# Patient Record
Sex: Female | Born: 2000 | Race: Black or African American | Hispanic: No | Marital: Single | Smoking: Never smoker
Health system: Southern US, Community
[De-identification: ages and names within clinical notes are randomized; demographics above are authoritative.]

## PROBLEM LIST (undated history)

## (undated) DIAGNOSIS — Z9889 Other specified postprocedural states: Secondary | ICD-10-CM

## (undated) DIAGNOSIS — R112 Nausea with vomiting, unspecified: Secondary | ICD-10-CM

## (undated) DIAGNOSIS — N926 Irregular menstruation, unspecified: Secondary | ICD-10-CM

## (undated) DIAGNOSIS — E669 Obesity, unspecified: Secondary | ICD-10-CM

## (undated) DIAGNOSIS — G43909 Migraine, unspecified, not intractable, without status migrainosus: Secondary | ICD-10-CM

## (undated) DIAGNOSIS — Z9109 Other allergy status, other than to drugs and biological substances: Secondary | ICD-10-CM

## (undated) DIAGNOSIS — T4145XA Adverse effect of unspecified anesthetic, initial encounter: Secondary | ICD-10-CM

## (undated) DIAGNOSIS — L732 Hidradenitis suppurativa: Secondary | ICD-10-CM

## (undated) DIAGNOSIS — M62838 Other muscle spasm: Secondary | ICD-10-CM

## (undated) DIAGNOSIS — K219 Gastro-esophageal reflux disease without esophagitis: Secondary | ICD-10-CM

## (undated) DIAGNOSIS — J45909 Unspecified asthma, uncomplicated: Secondary | ICD-10-CM

## (undated) HISTORY — PX: APPENDECTOMY: SHX54

## (undated) HISTORY — PX: TONSILLECTOMY: SUR1361

## (undated) SURGERY — EXCISION, HIDRADENITIS, AXILLA
Anesthesia: General | Laterality: Right

---
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
## 2019-01-30 ENCOUNTER — Other Ambulatory Visit: Payer: Self-pay

## 2019-01-30 ENCOUNTER — Encounter (HOSPITAL_COMMUNITY): Payer: Self-pay | Admitting: *Deleted

## 2019-01-30 ENCOUNTER — Emergency Department (HOSPITAL_COMMUNITY)
Admission: EM | Admit: 2019-01-30 | Discharge: 2019-01-30 | Disposition: A | Discharge status: Home / Self Care | Payer: Medicaid Other | Attending: Emergency Medicine | Admitting: Emergency Medicine

## 2019-01-30 DIAGNOSIS — Z7722 Contact with and (suspected) exposure to environmental tobacco smoke (acute) (chronic): Secondary | ICD-10-CM | POA: Diagnosis not present

## 2019-01-30 DIAGNOSIS — J45909 Unspecified asthma, uncomplicated: Secondary | ICD-10-CM | POA: Insufficient documentation

## 2019-01-30 DIAGNOSIS — Z79899 Other long term (current) drug therapy: Secondary | ICD-10-CM | POA: Diagnosis not present

## 2019-01-30 DIAGNOSIS — J020 Streptococcal pharyngitis: Secondary | ICD-10-CM | POA: Diagnosis not present

## 2019-01-30 DIAGNOSIS — J029 Acute pharyngitis, unspecified: Secondary | ICD-10-CM | POA: Diagnosis present

## 2019-01-30 LAB — GROUP A STREP BY PCR: Group A Strep by PCR: DETECTED — AB

## 2019-01-30 MED ORDER — AMOXICILLIN 500 MG PO CAPS
1000.0000 mg | ORAL_CAPSULE | Freq: Once | ORAL | Status: AC
  Administered 2019-01-30: 1000 mg via ORAL
  Filled 2019-01-30: qty 2

## 2019-01-30 MED ORDER — LIDOCAINE VISCOUS HCL 2 % MT SOLN
15.0000 mL | Freq: Once | OROMUCOSAL | Status: AC
  Administered 2019-01-30: 15 mL via OROMUCOSAL
  Filled 2019-01-30: qty 15

## 2019-01-30 MED ORDER — AMOXICILLIN 500 MG PO CAPS: 1000.0000 mg | ORAL_CAPSULE | Freq: Every day | ORAL | 0 refills | Status: AC

## 2019-01-30 MED ORDER — LIDOCAINE VISCOUS HCL 2 % MT SOLN: 15.0000 mL | OROMUCOSAL | 0 refills | Status: AC | PRN

## 2019-01-30 NOTE — ED Triage Notes (Signed)
To ED for eval of sore throat. Feels like her throat is closing. States this started yesterday. Tonsils appear swollen bilaterally but most pain on left. Throat red

## 2019-01-30 NOTE — Discharge Instructions (Addendum)
Thank you for allowing me to care for you today. Please return to the emergency department if you have new or worsening symptoms. Take your medications as instructed.  ° °

## 2019-01-30 NOTE — ED Provider Notes (Signed)
[facility] Provider Note   CSN: [id number] Arrival date & time: 01/30/19  [id number]    History   Chief Complaint Chief Complaint  Patient presents with  . Sore Throat    HPI Samantha Morse is a 18 y.o. female.     Patient is a 18 year old female with past medical history of asthma and obesity who presents the emergency department for sore throat since yesterday.  Has a history of recurrent tonsillitis.  Denies any fever, chills, nausea, vomiting, coughing, sick contacts.  Reports that it hurts to swallow and is worse on the left side and radiates up into her ear.  Has not tried anything for relief.     Past Medical History:  Diagnosis Date  . Asthma    prn inhaler  . Complication of anesthesia 04/27/2015   vomiting and laryngospasm on extubation, requiring CPAP, jaw thrust and succinylcholine  . 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 Irregular periods    mother denies chance of pregnancy  . Pollen allergy     Patient Active Problem List   Diagnosis Date Noted  . Obesity 06/28/2016  . Hidradenitis suppurativa 06/28/2016  . Amenorrhea 06/28/2016    Past Surgical History:  Procedure Laterality Date  . HYDRADENITIS EXCISION Left 04/27/2015   Procedure: EXCISION HIDRADENITIS LEFT AXILLA [name] CLOSURE ;  Surgeon: Louisa SecondGerald Truesdale, MD;  Location: [facility];  Service: Plastics;  Laterality: Left;  . HYDRADENITIS EXCISION Right 08/10/2015   Procedure: EXCISION HIDRADENITIS RIGHT AXILLA, RYAN POLLOCK CLOSURE;  Surgeon: Louisa SecondGerald Truesdale, MD;  Location: [facility];  Service: Plastics;  Laterality: Right;     OB History    Gravida  0   Para  0   Term  0   Preterm  0   AB  0   Living  0     SAB  0   TAB  0   Ectopic  0   Multiple  0   Live Births  0            Home Medications    Prior to Admission medications   Medication Sig Start Date End Date Taking? Authorizing Provider   albuterol (PROVENTIL HFA;VENTOLIN HFA) 108 (90 Base) MCG/ACT inhaler Inhale 1-2 puffs into the lungs every 6 (six) hours as needed for wheezing or shortness of breath. 12/03/18   Rancour, Jeannett SeniorStephen, MD  amoxicillin (AMOXIL) 500 MG capsule Take 2 capsules (1,000 mg total) by mouth daily for 10 days. 01/30/19 02/09/19  Ronnie DossMcLean, Raynisha Avilla A, PA-C  fluticasone (FLONASE) 50 MCG/ACT nasal spray Place 1 spray into both nostrils daily as needed for allergies.  05/23/18   [provider]  loratadine (CLARITIN) 10 MG tablet Take 10 mg by mouth daily as needed for allergies.  05/23/18   [provider]  Norgestimate-Ethinyl Estradiol Triphasic 0.18/0.215/0.25 MG-25 MCG tab Take 1 tablet by mouth daily.  03/20/18   [provider]  predniSONE (DELTASONE) 50 MG tablet 1 tablet PO daily 12/03/18   Glynn Octaveancour, Stephen, MD    Family History Family History  Problem Relation Age of Onset  . Hypertension Maternal Grandmother   . Kidney cancer Maternal Grandmother   . Hypertension Mother   . Heart disease Mother        MI  . Pulmonary embolism Mother   . Thyroid disease Mother   . Hernia Mother   . Diabetes Maternal Uncle   . Diabetes Maternal Grandfather   .  Hypertension Maternal Grandfather   . Cirrhosis Maternal Aunt     Social History Social History   Tobacco Use  . Smoking status: Passive Smoke Exposure - Never Smoker  . Smokeless tobacco: Never Used  . Tobacco comment: father smokes outside  Substance Use Topics  . Alcohol use: No  . Drug use: No     Allergies   Bee pollen and Pollen extract   Review of Systems Review of Systems  Constitutional: Negative for chills and fever.  HENT: Positive for ear pain, sore throat and voice change. Negative for congestion, dental problem, drooling, ear discharge, rhinorrhea and trouble swallowing.   Respiratory: Negative for cough and shortness of breath.   Gastrointestinal: Negative for abdominal pain and nausea.  Musculoskeletal:  Negative for myalgias.  Skin: Negative for rash.  Allergic/Immunologic: Negative for immunocompromised state.  Neurological: Negative for dizziness and headaches.     Physical Exam Updated Vital Signs BP (!) 155/93 (BP Location: Right Wrist)   Pulse 87   Temp 98.3 F (36.8 C) (Oral)   Resp (!) 22   SpO2 100%   Physical Exam   ED Treatments / Results  Labs (all labs ordered are listed, but only abnormal results are displayed) Labs Reviewed  GROUP A STREP BY PCR - Abnormal; Notable for the following components:      Result Value   Group A Strep by PCR DETECTED (*)    All other components within normal limits    EKG None  Radiology No results found.  Procedures Procedures (including critical care time)  Medications Ordered in ED Medications  lidocaine (XYLOCAINE) 2 % viscous mouth solution 15 mL (has no administration in time range)  amoxicillin (AMOXIL) capsule 1,000 mg (has no administration in time range)     Initial Impression / Assessment and Plan / ED Course  I have reviewed the triage vital signs and the nursing notes.  Pertinent labs & imaging results that were available during my care of the patient were reviewed by me and considered in my medical decision making (see chart for details).          Final Clinical Impressions(s) / ED Diagnoses   Final diagnoses:  Strep throat    ED Discharge Orders         Ordered    amoxicillin (AMOXIL) 500 MG capsule  Daily     01/30/19 0854           Arlyn Dunning, PA-C 01/30/19 0854    Terrilee Files, MD 01/30/19 [phone]

## 2019-02-11 ENCOUNTER — Encounter (HOSPITAL_COMMUNITY): Payer: Self-pay | Admitting: *Deleted

## 2019-02-11 ENCOUNTER — Emergency Department (HOSPITAL_COMMUNITY)
Admission: EM | Admit: 2019-02-11 | Discharge: 2019-02-12 | Disposition: A | Discharge status: Home / Self Care | Payer: Medicaid Other | Attending: Emergency Medicine | Admitting: Emergency Medicine

## 2019-02-11 ENCOUNTER — Other Ambulatory Visit: Payer: Self-pay

## 2019-02-11 DIAGNOSIS — J029 Acute pharyngitis, unspecified: Secondary | ICD-10-CM | POA: Diagnosis not present

## 2019-02-11 DIAGNOSIS — Z7722 Contact with and (suspected) exposure to environmental tobacco smoke (acute) (chronic): Secondary | ICD-10-CM | POA: Diagnosis not present

## 2019-02-11 DIAGNOSIS — Z79899 Other long term (current) drug therapy: Secondary | ICD-10-CM | POA: Diagnosis not present

## 2019-02-11 DIAGNOSIS — J45909 Unspecified asthma, uncomplicated: Secondary | ICD-10-CM | POA: Insufficient documentation

## 2019-02-11 MED ORDER — DEXAMETHASONE SODIUM PHOSPHATE 10 MG/ML IJ SOLN
10.0000 mg | Freq: Once | INTRAMUSCULAR | Status: AC
  Administered 2019-02-12: 10 mg via INTRAMUSCULAR
  Filled 2019-02-11: qty 1

## 2019-02-11 MED ORDER — CLINDAMYCIN HCL 150 MG PO CAPS: 300.0000 mg | ORAL_CAPSULE | Freq: Three times a day (TID) | ORAL | 0 refills | Status: DC

## 2019-02-11 NOTE — ED Provider Notes (Signed)
[facility]   CSN: [id number] Arrival date & time: 02/11/19  2313     History   Chief Complaint Chief Complaint  Patient presents with  . Sore Throat    HPI Samantha Morse is a 18 y.o. female.     The history is provided by the patient and medical records.  Sore Throat    18 year old female with history of asthma, seasonal allergies, presenting to the ED with sore throat.  She was seen in the ED on 01/30/2019 and diagnosed with strep throat.  She was prescribed course of amoxicillin which she finished and states she was feeling mostly back to normal, but symptoms started to return the following day.  States she has pain straight down the middle of her throat, worse with swallowing but she is not having any difficulty swallowing her secretions, eating, or drinking.  She denies fever.  She reports history of recurrent tonsillitis, was supposed to have her tonsils and adenoid removed last year but never got around to it.  She is seeing her ENT doctor on Friday (4 days from now).  Past Medical History:  Diagnosis Date  . Asthma    prn inhaler  . Complication of anesthesia 04/27/2015   vomiting and laryngospasm on extubation, requiring CPAP, jaw thrust and succinylcholine  . 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 Irregular periods    mother denies chance of pregnancy  . Pollen allergy     Patient Active Problem List   Diagnosis Date Noted  . Obesity 06/28/2016  . Hidradenitis suppurativa 06/28/2016  . Amenorrhea 06/28/2016    Past Surgical History:  Procedure Laterality Date  . HYDRADENITIS EXCISION Left 04/27/2015   Procedure: EXCISION HIDRADENITIS LEFT AXILLA [name] CLOSURE ;  Surgeon: Louisa SecondGerald Truesdale, MD;  Location: [facility];  Service: Plastics;  Laterality: Left;  . HYDRADENITIS EXCISION Right 08/10/2015   Procedure: EXCISION HIDRADENITIS RIGHT AXILLA, RYAN POLLOCK CLOSURE;  Surgeon: Louisa SecondGerald  Truesdale, MD;  Location: [facility];  Service: Plastics;  Laterality: Right;     OB History    Gravida  0   Para  0   Term  0   Preterm  0   AB  0   Living  0     SAB  0   TAB  0   Ectopic  0   Multiple  0   Live Births  0            Home Medications    Prior to Admission medications   Medication Sig Start Date End Date Taking? Authorizing Provider  albuterol (PROVENTIL HFA;VENTOLIN HFA) 108 (90 Base) MCG/ACT inhaler Inhale 1-2 puffs into the lungs every 6 (six) hours as needed for wheezing or shortness of breath. 12/03/18   Rancour, Jeannett SeniorStephen, MD  fluticasone (FLONASE) 50 MCG/ACT nasal spray Place 1 spray into both nostrils daily as needed for allergies.  05/23/18   [provider]  loratadine (CLARITIN) 10 MG tablet Take 10 mg by mouth daily as needed for allergies.  05/23/18   [provider]  Norgestimate-Ethinyl Estradiol Triphasic 0.18/0.215/0.25 MG-25 MCG tab Take 1 tablet by mouth daily.  03/20/18   [provider]  predniSONE (DELTASONE) 50 MG tablet 1 tablet PO daily 12/03/18   Glynn Octaveancour, Stephen, MD    Family History Family History  Problem Relation Age of Onset  . Hypertension Maternal Grandmother   . Kidney cancer Maternal Grandmother   .  Hypertension Mother   . Heart disease Mother        MI  . Pulmonary embolism Mother   . Thyroid disease Mother   . Hernia Mother   . Diabetes Maternal Uncle   . Diabetes Maternal Grandfather   . Hypertension Maternal Grandfather   . Cirrhosis Maternal Aunt     Social History Social History   Tobacco Use  . Smoking status: Passive Smoke Exposure - Never Smoker  . Smokeless tobacco: Never Used  . Tobacco comment: father smokes outside  Substance Use Topics  . Alcohol use: No  . Drug use: No     Allergies   Bee pollen and Pollen extract   Review of Systems Review of Systems  HENT: Positive for sore throat.   All other systems reviewed and are negative.     Physical Exam Updated Vital Signs BP (!) 148/84   Pulse 94   Temp 98.4 F (36.9 C)   Resp 18   LMP 01/14/2019   SpO2 100%   Physical Exam Vitals signs and nursing note reviewed.  Constitutional:      Appearance: She is well-developed.  HENT:     Head: Normocephalic and atraumatic.     Mouth/Throat:     Comments: Tonsils 2+ bilaterally, symmetric, with small exudates noted; uvula midline without evidence of peritonsillar abscess; handling secretions appropriately; no difficulty swallowing or speaking; normal phonation without stridor Eyes:     Conjunctiva/sclera: Conjunctivae normal.     Pupils: Pupils are equal, round, and reactive to light.  Neck:     Musculoskeletal: Normal range of motion.  Cardiovascular:     Rate and Rhythm: Normal rate and regular rhythm.     Heart sounds: Normal heart sounds.  Pulmonary:     Effort: Pulmonary effort is normal.     Breath sounds: Normal breath sounds.  Abdominal:     General: Bowel sounds are normal.     Palpations: Abdomen is soft.  Musculoskeletal: Normal range of motion.  Lymphadenopathy:     Comments: Enlarged tonsillar lymph nodes bilaterally, locally tender  Skin:    General: Skin is warm and dry.  Neurological:     Mental Status: She is alert and oriented to person, place, and time.      ED Treatments / Results  Labs (all labs ordered are listed, but only abnormal results are displayed) Labs Reviewed - No data to display  EKG    Radiology No results found.  Procedures Procedures (including critical care time)  Medications Ordered in ED Medications  dexamethasone (DECADRON) injection 10 mg (10 mg Intramuscular Given 02/12/19 0010)     Initial Impression / Assessment and Plan / ED Course  I have reviewed the triage vital signs and the nursing notes.  Pertinent labs & imaging results that were available during my care of the patient were reviewed by me and considered in my medical decision making (see chart  for details).  18 year old female here with sore throat.  She was recently diagnosed with strep throat and completed course of amoxicillin and felt mostly better but symptoms began returning the following day.  Pain is middle of throat and reported to be symmetric.  She is afebrile and nontoxic in appearance here.  She does have tonsillar edema bilaterally but this is symmetric and uvula remains midline.  She is handling her secretions well, normal phonation without stridor.  She does have some tonsillar lymphadenopathy which is likely reactive.  At this time, she does not  have any signs or symptoms concerning for peritonsillar abscess, however I do have concern for a partially treated infection.  We will give her dose of Decadron here and switch to clindamycin.  She has scheduled follow-up with her ENT specialist later this week.  Final Clinical Impressions(s) / ED Diagnoses   Final diagnoses:  Sore throat    ED Discharge Orders         Ordered    clindamycin (CLEOCIN) 150 MG capsule  3 times daily     02/11/19 2344           Garlon HatchetSanders, [place] Blucher M, PA-C 02/12/19 [id number]    Gilda CreasePollina, Christopher J, MD 02/12/19 [phone]

## 2019-02-11 NOTE — Discharge Instructions (Addendum)
Take the prescribed medication as directed.  Can continue viscous lidocaine and/or salt water gargles, warm tea, cold popsicles, etc. Follow-up with your ENT doctor on Friday as scheduled. Return to the ED for new or worsening symptoms.

## 2019-02-11 NOTE — ED Triage Notes (Signed)
Pt c/o sore throat for several days; was seen here and dx with strep; reports pain feels more severe. Also c/o chills

## 2019-02-18 ENCOUNTER — Encounter (HOSPITAL_COMMUNITY): Payer: Self-pay | Admitting: Radiology

## 2019-02-18 ENCOUNTER — Inpatient Hospital Stay (HOSPITAL_COMMUNITY)
Admission: EM | Admit: 2019-02-18 | Discharge: 2019-02-19 | DRG: 153 | Disposition: A | Discharge status: Home / Self Care | Payer: Medicaid Other | Attending: Otolaryngology | Admitting: Otolaryngology

## 2019-02-18 ENCOUNTER — Emergency Department (HOSPITAL_COMMUNITY): Payer: Medicaid Other

## 2019-02-18 ENCOUNTER — Other Ambulatory Visit: Payer: Self-pay

## 2019-02-18 DIAGNOSIS — Z1159 Encounter for screening for other viral diseases: Secondary | ICD-10-CM

## 2019-02-18 DIAGNOSIS — Z79899 Other long term (current) drug therapy: Secondary | ICD-10-CM

## 2019-02-18 DIAGNOSIS — J45909 Unspecified asthma, uncomplicated: Secondary | ICD-10-CM | POA: Diagnosis present

## 2019-02-18 DIAGNOSIS — Z7952 Long term (current) use of systemic steroids: Secondary | ICD-10-CM | POA: Diagnosis not present

## 2019-02-18 DIAGNOSIS — Z793 Long term (current) use of hormonal contraceptives: Secondary | ICD-10-CM

## 2019-02-18 DIAGNOSIS — Z8619 Personal history of other infectious and parasitic diseases: Secondary | ICD-10-CM | POA: Diagnosis not present

## 2019-02-18 DIAGNOSIS — J36 Peritonsillar abscess: Principal | ICD-10-CM | POA: Diagnosis present

## 2019-02-18 DIAGNOSIS — R0683 Snoring: Secondary | ICD-10-CM | POA: Diagnosis present

## 2019-02-18 DIAGNOSIS — R070 Pain in throat: Secondary | ICD-10-CM | POA: Diagnosis not present

## 2019-02-18 DIAGNOSIS — Z7722 Contact with and (suspected) exposure to environmental tobacco smoke (acute) (chronic): Secondary | ICD-10-CM | POA: Diagnosis present

## 2019-02-18 LAB — CBC WITH DIFFERENTIAL/PLATELET
Abs Immature Granulocytes: 0.04 10*3/uL (ref 0.00–0.07)
Basophils Absolute: 0 10*3/uL (ref 0.0–0.1)
Basophils Relative: 0 %
Eosinophils Absolute: 0.4 10*3/uL (ref 0.0–0.5)
Eosinophils Relative: 3 %
HCT: 39.9 % (ref 36.0–46.0)
Hemoglobin: 12.9 g/dL (ref 12.0–15.0)
Immature Granulocytes: 0 %
Lymphocytes Relative: 27 %
Lymphs Abs: 2.7 10*3/uL (ref 0.7–4.0)
MCH: 28.9 pg (ref 26.0–34.0)
MCHC: 32.3 g/dL (ref 30.0–36.0)
MCV: 89.3 fL (ref 80.0–100.0)
Monocytes Absolute: 0.7 10*3/uL (ref 0.1–1.0)
Monocytes Relative: 7 %
Neutro Abs: 6.4 10*3/uL (ref 1.7–7.7)
Neutrophils Relative %: 63 %
Platelets: 329 10*3/uL (ref 150–400)
RBC: 4.47 MIL/uL (ref 3.87–5.11)
RDW: 11.9 % (ref 11.5–15.5)
WBC: 10.2 10*3/uL (ref 4.0–10.5)
nRBC: 0 % (ref 0.0–0.2)

## 2019-02-18 LAB — COMPREHENSIVE METABOLIC PANEL
ALT: 22 U/L (ref 0–44)
AST: 16 U/L (ref 15–41)
Albumin: 3.4 g/dL — ABNORMAL LOW (ref 3.5–5.0)
Alkaline Phosphatase: 63 U/L (ref 38–126)
Anion gap: 10 (ref 5–15)
BUN: 6 mg/dL (ref 6–20)
CO2: 23 mmol/L (ref 22–32)
Calcium: 9.2 mg/dL (ref 8.9–10.3)
Chloride: 107 mmol/L (ref 98–111)
Creatinine, Ser: 0.61 mg/dL (ref 0.44–1.00)
GFR calc Af Amer: >60 mL/min (ref >60)
GFR calc non Af Amer: >60 mL/min (ref >60)
Glucose, Bld: 100 mg/dL — ABNORMAL HIGH (ref 70–99)
Potassium: 3.8 mmol/L (ref 3.5–5.1)
Sodium: 140 mmol/L (ref 135–145)
Total Bilirubin: 0.9 mg/dL (ref 0.3–1.2)
Total Protein: 7.7 g/dL (ref 6.5–8.1)

## 2019-02-18 LAB — GROUP A STREP BY PCR: Group A Strep by PCR: DETECTED — AB

## 2019-02-18 LAB — LACTIC ACID, PLASMA: Lactic Acid, Venous: 0.8 mmol/L (ref 0.5–1.9)

## 2019-02-18 LAB — I-STAT BETA HCG BLOOD, ED (MC, WL, AP ONLY): I-stat hCG, quantitative: <5 m[IU]/mL (ref <5)

## 2019-02-18 LAB — SARS CORONAVIRUS 2 BY RT PCR (HOSPITAL ORDER, PERFORMED IN ~~LOC~~ HOSPITAL LAB): SARS Coronavirus 2: NEGATIVE

## 2019-02-18 LAB — MONONUCLEOSIS SCREEN: Mono Screen: NEGATIVE

## 2019-02-18 MED ORDER — SODIUM CHLORIDE 0.9 % IV SOLN
3.0000 g | Freq: Once | INTRAVENOUS | Status: AC
  Administered 2019-02-18: 3 g via INTRAVENOUS
  Filled 2019-02-18 (×2): qty 3

## 2019-02-18 MED ORDER — IOHEXOL 300 MG/ML  SOLN
100.0000 mL | Freq: Once | INTRAMUSCULAR | Status: AC | PRN
  Administered 2019-02-18: 100 mL via INTRAVENOUS

## 2019-02-18 MED ORDER — SODIUM CHLORIDE 0.9 % IV BOLUS
1000.0000 mL | Freq: Once | INTRAVENOUS | Status: AC
  Administered 2019-02-18: 1000 mL via INTRAVENOUS

## 2019-02-18 MED ORDER — MORPHINE SULFATE (PF) 2 MG/ML IV SOLN
2.0000 mg | INTRAVENOUS | Status: DC | PRN
  Administered 2019-02-18: 2 mg via INTRAVENOUS
  Filled 2019-02-18: qty 1

## 2019-02-18 MED ORDER — IBUPROFEN 100 MG/5ML PO SUSP
600.0000 mg | Freq: Four times a day (QID) | ORAL | Status: DC
  Administered 2019-02-18 – 2019-02-19 (×2): 600 mg via ORAL
  Filled 2019-02-18 (×5): qty 30

## 2019-02-18 MED ORDER — ONDANSETRON HCL 4 MG PO TABS: 4.0000 mg | ORAL_TABLET | ORAL | Status: DC | PRN

## 2019-02-18 MED ORDER — ONDANSETRON HCL 4 MG/2ML IJ SOLN: 4.0000 mg | INTRAMUSCULAR | Status: DC | PRN

## 2019-02-18 MED ORDER — BENZOCAINE 20 % MT AERO
INHALATION_SPRAY | Freq: Once | OROMUCOSAL | Status: AC
  Administered 2019-02-18: 12:00:00 via OROMUCOSAL
  Filled 2019-02-18: qty 57

## 2019-02-18 MED ORDER — SODIUM CHLORIDE 0.9 % IV SOLN
3.0000 g | Freq: Four times a day (QID) | INTRAVENOUS | Status: DC
  Administered 2019-02-18 – 2019-02-19 (×3): 3 g via INTRAVENOUS
  Filled 2019-02-18 (×5): qty 3

## 2019-02-18 MED ORDER — DEXAMETHASONE SODIUM PHOSPHATE 10 MG/ML IJ SOLN
10.0000 mg | Freq: Once | INTRAMUSCULAR | Status: AC
  Administered 2019-02-18: 10 mg via INTRAVENOUS
  Filled 2019-02-18: qty 1

## 2019-02-18 MED ORDER — LIDOCAINE HCL 2 % IJ SOLN
10.0000 mL | Freq: Once | INTRAMUSCULAR | Status: AC
  Administered 2019-02-18: 200 mg via INTRADERMAL
  Filled 2019-02-18: qty 10

## 2019-02-18 MED ORDER — PHENOL 1.4 % MT LIQD
1.0000 | OROMUCOSAL | Status: DC | PRN
  Administered 2019-02-18: 1 via OROMUCOSAL
  Filled 2019-02-18: qty 177

## 2019-02-18 MED ORDER — DEXTROSE-NACL 5-0.9 % IV SOLN
INTRAVENOUS | Status: DC
  Administered 2019-02-18: 19:00:00 via INTRAVENOUS

## 2019-02-18 MED ORDER — LIDOCAINE HCL (PF) 2 % IJ SOLN: 10.0000 mL | Freq: Once | INTRAMUSCULAR | Status: DC

## 2019-02-18 MED ORDER — MORPHINE SULFATE (PF) 2 MG/ML IV SOLN
2.0000 mg | Freq: Once | INTRAVENOUS | Status: AC
  Administered 2019-02-18: 2 mg via INTRAVENOUS
  Filled 2019-02-18: qty 1

## 2019-02-18 MED ORDER — ONDANSETRON HCL 4 MG/2ML IJ SOLN
4.0000 mg | Freq: Once | INTRAMUSCULAR | Status: AC
  Administered 2019-02-18: 4 mg via INTRAVENOUS
  Filled 2019-02-18: qty 2

## 2019-02-18 NOTE — Progress Notes (Signed)
New Admission Note:   Arrival Method: Wheelchair  Mental Orientation: Alert and oriented  Telemetry: none  Assessment: Completed Skin: IV: Left  Forearm  Pain: 5/10 Tubes: none  Safety Measures: Safety Fall Prevention Plan has been given, discussed and signed Admission: Completed [facility] Orientation: Patient has been orientated to the room, unit and staff.  Family: none   Orders have been reviewed and implemented. Will continue to monitor the patient. Call light has been placed within reach and bed alarm has been activated.   Mylan Lengyel RN [facility] Phone: [phone]

## 2019-02-18 NOTE — ED Notes (Signed)
Pt given gatorade ok per MD

## 2019-02-18 NOTE — ED Triage Notes (Signed)
Patient c/o sore throat and difficulty breathing. Was dx 2 weeks ago with strep throat and treated, has hx of tonsillitis.

## 2019-02-18 NOTE — H&P (Signed)
OTOLARYNGOLOGY CONSULTATION  Referring Physician: Dr. Tonette LedererKuhner Primary [facility] Phys[facility]: [facility], [facility] Patient Location at Initial Consult: Emergency Department Chief Complaint/Reason for Consult: left PTA  History of Presenting Illness:  History obtained from patient, parent, EMR Samantha Morse is a  18 y.o. female presenting with  Left sided throat pain. This has been ongoing for more than a week, worsening the last few days. Some limited mouth-opening and associated left-sided ear pain. Has been taking clindamycin at home.  Prior to this she had a strep throat infection couple weeks ago that was treated with amoxicillin.  Of note, both my partner Dr. Pollyann Kennedyosen and I have seen her in clinic and recommended tonsillectomy recurrent infections and snoring symptoms.  She has been able to keep her self hydrated but has had difficult time eating the past few days.  Prior to me seeing her in the emergency department she got a dose of IV Unasyn.  She is been afebrile.  COVID testing was negative.  Past Medical History:  Diagnosis Date  . Asthma    prn inhaler  . Complication of anesthesia 04/27/2015   vomiting and laryngospasm on extubation, requiring CPAP, jaw thrust and succinylcholine  . 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 Irregular periods    mother denies chance of pregnancy  . Pollen allergy     Past Surgical History:  Procedure Laterality Date  . HYDRADENITIS EXCISION Left 04/27/2015   Procedure: EXCISION HIDRADENITIS LEFT AXILLA [name] CLOSURE ;  Surgeon: Louisa SecondGerald Truesdale, MD;  Location: [facility];  Service: Plastics;  Laterality: Left;  . HYDRADENITIS EXCISION Right 08/10/2015   Procedure: EXCISION HIDRADENITIS RIGHT AXILLA, RYAN POLLOCK CLOSURE;  Surgeon: Louisa SecondGerald Truesdale, MD;  Location: [facility];  Service: Plastics;  Laterality: Right;    Family History  Problem Relation Age of Onset  . Hypertension Maternal  Grandmother   . Kidney cancer Maternal Grandmother   . Hypertension Mother   . Heart disease Mother        MI  . Pulmonary embolism Mother   . Thyroid disease Mother   . Hernia Mother   . Diabetes Maternal Uncle   . Diabetes Maternal Grandfather   . Hypertension Maternal Grandfather   . Cirrhosis Maternal Aunt     Social History   Socioeconomic History  . Marital status: Single    Spouse name: Not on file  . Number of children: Not on file  . Years of education: Not on file  . Highest education level: Not on file  Occupational History  . Not on file  Social Needs  . Financial resource strain: Not on file  . Food insecurity    Worry: Not on file    Inability: Not on file  . Transportation needs    Medical: Not on file    Non-medical: Not on file  Tobacco Use  . Smoking status: Passive Smoke Exposure - Never Smoker  . Smokeless tobacco: Never Used  . Tobacco comment: father smokes outside  Substance and Sexual Activity  . Alcohol use: No  . Drug use: No  . Sexual activity: Never    Birth control/protection: None  Lifestyle  . Physical activity    Days per week: Not on file    Minutes per session: Not on file  . Stress: Not on file  Relationships  . Social Musicianconnections    Talks on phone: Not on file    Gets together: Not on file  Attends religious service: Not on file    Active member of club or organization: Not on file    Attends meetings of clubs or organizations: Not on file    Relationship status: Not on file  Other Topics Concern  . Not on file  Social History Narrative  . Not on file    No current facility-administered medications on file prior to encounter.    Current Outpatient Medications on File Prior to Encounter  Medication Sig Dispense Refill  . albuterol (PROVENTIL HFA;VENTOLIN HFA) 108 (90 Base) MCG/ACT inhaler Inhale 1-2 puffs into the lungs every 6 (six) hours as needed for wheezing or shortness of breath. 1 Inhaler 0  . clindamycin  (CLEOCIN) 150 MG capsule Take 2 capsules (300 mg total) by mouth 3 (three) times daily. May dispense as 150mg  capsules 60 capsule 0  . fluticasone (FLONASE) 50 MCG/ACT nasal spray Place 1 spray into both nostrils daily as needed for allergies.   11  . loratadine (CLARITIN) 10 MG tablet Take 10 mg by mouth daily as needed for allergies.   11  . Norgestimate-Ethinyl Estradiol Triphasic 0.18/0.215/0.25 MG-25 MCG tab Take 1 tablet by mouth daily.   6  . predniSONE (DELTASONE) 50 MG tablet 1 tablet PO daily 5 tablet 0    Allergies  Allergen Reactions  . Bee Pollen     Other reaction(s): Other (See Comments) REDNESS OF EYES, STUFFY NOSE  . Pollen Extract Other (See Comments)    REDNESS OF EYES, STUFFY NOSE     Review of Systems: Complete and negative except for the above   OBJECTIVE: Vital Signs: Vitals:   02/18/19 0641 02/18/19 0903  BP: (!) 154/74 (!) 157/84  Pulse: 96 89  Resp: 18 18  Temp: 99.3 F (37.4 C)   SpO2: 99% 99%    I&O No intake or output data in the 24 hours ending 02/18/19 1028  Physical Exam General: Well developed, well nourished.  Obese, thick neck.  no acute distress. Voice moderate hot potato quality  Head/Face: Normocephalic, atraumatic. No scars or lesions. No sinus tenderness. Facial nerve intact and equal bilaterally.  No facial lacerations. Salivary glands non tender and without palpable masses  Eyes: Globes well positioned, no proptosis Lids: No periorbital edema/ecchymosis. No lid laceration Conjunctiva: No chemosis, hemorrhage PE RRL  Ears: No gross deformity. Normal external canal. Tympanic membrane intact and without middle ear effusion bilaterally  Hearing: Normal speech reception.  Nose: No gross deformity or lesions. No purulent discharge. Septum midline. No turbinate hypertrophy.  Mouth/Oropharynx: Lips without any lesions. Dentition good. No mucosal lesions within the oropharynx.  There is mild trismus.  The left peritonsillar region is  obviously edematous and erythematous with mild uvular deviation to the right.  Palpable fluctuance in the left peritonsillar region.  Right tonsil is normal but pre-existing 3+  Neck: Trachea midline. No masses. No thyromegaly or nodules palpated. No crepitus.  Lymphatic: No lymphadenopathy in the neck.  Respiratory: No stridor or distress.  Cardiovascular: Regular rate and rhythm.  Extremities: No edema or cyanosis. Warm and well-perfused.  Skin: No scars or lesions on face or neck.  Neurologic: CN II-XII intact. Moving all extremities without gross abnormality.  Other:      Labs: Lab Results  Component Value Date   WBC 10.2 02/18/2019   HGB 12.9 02/18/2019   HCT 39.9 02/18/2019   PLT 329 02/18/2019   ALT 22 02/18/2019   AST 16 02/18/2019   NA 140 02/18/2019   K 3.8  02/18/2019   CL 107 02/18/2019   CREATININE 0.61 02/18/2019   BUN 6 02/18/2019   CO2 23 02/18/2019   TSH 2.280 06/28/2016   HGBA1C 4.7 (L) 06/28/2016     Review of Ancillary Data / Diagnostic Tests: CT neck W contrast- left ill-defined PTA with some rim-enhancement, at least 2cm, poorly contrasted.   Procedure note: Left peritonsillar abscess drainage Findings: The left peritonsillar region demonstrated a purulent abscess cavity with about 5 cc of pus extruded from the wound.  Trismus immediately improved after drainage. Details: Informed consent was obtained verbally.  The left peritonsillar region was anesthetized with 1% lidocaine and epinephrine 1: 100,000 as well as Hurricaine spray.  An incision was made with a 15 blade.  Dissection down to the peritonsillar space was accomplished with a hemostat.  Frank purulence was identified and a wound culture was obtained and passed off the field.  All loculations were opened with a hemostat.  The wound bed was then copiously irrigated with normal saline.  This was then suctioned.  All instrumentation was removed.  The patient tolerated the procedure very well and there  were no complications.  ASSESSMENT:  18 y.o. female with left PTA.  Status post incision and drainage.  RECOMMENDATIONS: -We will admit for at least 1 night for IV antibiotics to include IV Unasyn given her failure to respond on [place] outpatient. Soft diet Warm salt water irrigations I will check on her again in the morning. Will follow wound culture results.   Gavin Pound, MD  [facility], [place] Office phone [phone]

## 2019-02-18 NOTE — ED Provider Notes (Signed)
[place] EMERGENCY DEPARTMENT Provider Note   CSN: [id number] Arrival date & time: 02/18/19  3790    History   Chief Complaint Chief Complaint  Patient presents with  . Sore Throat    HPI Samantha Morse is a 18 y.o. female.     Patient is a 18 year old female who presents for sore throat.  Patient with history of obesity and recurrent tonsillitis.  Patient had been diagnosed with strep throat on June 3 (20 days ago).  She took amoxicillin and improved for a few days.  Patient then returned to the ED with another sore throat on June 15 (7 days ago).  She was given dexamethasone and clindamycin.  On June 18 (4 days ago) she was seen by ENT, and told to continue the clindamycin and follow-up after the infection has resolved for possible tonsillectomy.  Over the past 4 days the throat pain is gotten worse and he has become more painful and she feels like there is more swelling on the left side of her throat and going up to her ear.  Her voice is muffled and seems to be worse than when she presented to the ENT 4 days ago.  No fevers.  She is tolerating some fluids.  The history is provided by the patient. No language interpreter was used.  Sore Throat This is a new problem. The current episode started more than 2 days ago. The problem occurs constantly. The problem has been gradually worsening. Pertinent negatives include no chest pain, no abdominal pain, no headaches and no shortness of breath. The symptoms are aggravated by swallowing. Nothing relieves the symptoms. Treatments tried: clindamycin. The treatment provided no relief.    Past Medical History:  Diagnosis Date  . Asthma    prn inhaler  . Complication of anesthesia 04/27/2015   vomiting and laryngospasm on extubation, requiring CPAP, jaw thrust and succinylcholine  . 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 Irregular periods    mother denies chance of pregnancy  . Pollen allergy     Patient Active  Problem List   Diagnosis Date Noted  . Peritonsillar abscess 02/18/2019  . Obesity 06/28/2016  . Hidradenitis suppurativa 06/28/2016  . Amenorrhea 06/28/2016    Past Surgical History:  Procedure Laterality Date  . HYDRADENITIS EXCISION Left 04/27/2015   Procedure: EXCISION HIDRADENITIS LEFT AXILLA RYAN [place] ;  Surgeon: Cristine Polio, MD;  Location: [facility];  Service: Plastics;  Laterality: Left;  . HYDRADENITIS EXCISION Right 08/10/2015   Procedure: EXCISION HIDRADENITIS RIGHT AXILLA, RYAN POLLOCK CLOSURE;  Surgeon: Cristine Polio, MD;  Location: [facility];  Service: Plastics;  Laterality: Right;     OB History    Gravida  0   Para  0   Term  0   Preterm  0   AB  0   Living  0     SAB  0   TAB  0   Ectopic  0   Multiple  0   Live Births  0            Home Medications    Prior to Admission medications   Medication Sig Start Date End Date Taking? Authorizing Provider  albuterol (PROVENTIL HFA;VENTOLIN HFA) 108 (90 Base) MCG/ACT inhaler Inhale 1-2 puffs into the lungs every 6 (six) hours as needed for wheezing or shortness of breath. 12/03/18   Rancour, Annie Main, MD  clindamycin (CLEOCIN) 150 MG capsule Take 2 capsules (300 mg  total) by mouth 3 (three) times daily. May dispense as 150mg  capsules 02/11/19   Garlon HatchetSanders, Lisa M, PA-C  fluticasone [facility](FLONASE) 50 MCG/ACT nasal spray Place 1 spray into both nostrils daily as needed for allergies.  05/23/18   [provider]  loratadine (CLARITIN) 10 MG tablet Take 10 mg by mouth daily as needed for allergies.  05/23/18   [provider]  Norgestimate-Ethinyl Estradiol Triphasic 0.18/0.215/0.25 MG-25 MCG tab Take 1 tablet by mouth daily.  03/20/18   [provider]  predniSONE (DELTASONE) 50 MG tablet 1 tablet PO daily 12/03/18   Glynn Octaveancour, Stephen, MD    Family History Family History  Problem Relation Age of Onset  . Hypertension Maternal Grandmother   .  Kidney cancer Maternal Grandmother   . Hypertension Mother   . Heart disease Mother        MI  . Pulmonary embolism Mother   . Thyroid disease Mother   . Hernia Mother   . Diabetes Maternal Uncle   . Diabetes Maternal Grandfather   . Hypertension Maternal Grandfather   . Cirrhosis Maternal Aunt     Social History Social History   Tobacco Use  . Smoking status: Passive Smoke Exposure - Never Smoker  . Smokeless tobacco: Never Used  . Tobacco comment: father smokes outside  Substance Use Topics  . Alcohol use: No  . Drug use: No     Allergies   Bee pollen and Pollen extract   Review of Systems Review of Systems  Respiratory: Negative for shortness of breath.   Cardiovascular: Negative for chest pain.  Gastrointestinal: Negative for abdominal pain.  Neurological: Negative for headaches.  All other systems reviewed and are negative.    Physical Exam Updated Vital Signs BP (!) 157/84 (BP Location: Right Arm)   Pulse 89   Temp 99.3 F (37.4 C) (Oral)   Resp 18   Wt 136.1 kg   SpO2 99%   BMI 53.14 kg/m   Physical Exam Vitals signs and nursing note reviewed.  Constitutional:      Appearance: She is well-developed.  HENT:     Head: Normocephalic and atraumatic.     Right Ear: Tympanic membrane and external ear normal.     Left Ear: Tympanic membrane and external ear normal. No swelling.     Mouth/Throat:     Pharynx: Pharyngeal swelling and posterior oropharyngeal erythema present.     Tonsils: Tonsillar abscess present. 2+ on the right. 4+ on the left.     Comments: There is bilateral tonsillar hypertrophy, worse on the left.  There seems to be peritonsillar abscesses the uvula is being pushed towards the right.  There is significant lymphadenopathy and swelling of the cervical lymph nodes. Eyes:     Conjunctiva/sclera: Conjunctivae normal.  Neck:     Musculoskeletal: Normal range of motion and neck supple.  Cardiovascular:     Rate and Rhythm: Normal  rate.     Heart sounds: Normal heart sounds.  Pulmonary:     Effort: Pulmonary effort is normal.     Breath sounds: Normal breath sounds.  Abdominal:     General: Bowel sounds are normal.     Palpations: Abdomen is soft.     Tenderness: There is no abdominal tenderness. There is no rebound.  Musculoskeletal: Normal range of motion.  Skin:    General: Skin is warm.  Neurological:     Mental Status: She is alert and oriented to person, place, and time.  ED Treatments / Results  Labs (all labs ordered are listed, but only abnormal results are displayed) Labs Reviewed  GROUP A STREP BY PCR - Abnormal; Notable for the following components:      Result Value   Group A Strep by PCR DETECTED (*)    All other components within normal limits  COMPREHENSIVE METABOLIC PANEL - Abnormal; Notable for the following components:   Glucose, Bld 100 (*)    Albumin 3.4 (*)    All other components within normal limits  SARS CORONAVIRUS 2 (HOSPITAL ORDER, PERFORMED IN [facility])  AEROBIC/ANAEROBIC CULTURE (SURGICAL/DEEP WOUND)  LACTIC ACID, PLASMA  CBC WITH DIFFERENTIAL/PLATELET  MONONUCLEOSIS SCREEN  I-STAT BETA HCG BLOOD, ED ([facility], [facility], [facility] ONLY)    EKG None  Radiology Ct Soft Tissue Neck W Contrast  Result Date: 02/18/2019 CLINICAL DATA:  Sore throat/stridor, epiglottitis or tonsillitis suspected. Strep throat diagnosed 2 weeks ago EXAM: CT NECK WITH CONTRAST TECHNIQUE: Multidetector CT imaging of the neck was performed using the standard protocol following the bolus administration of intravenous contrast. CONTRAST:  100mL OMNIPAQUE IOHEXOL 300 MG/ML  SOLN COMPARISON:  None. FINDINGS: Pharynx and larynx: Thickening of the tonsils with a 2 cm left peritonsillar collection deforming the oropharynx. The supraglottic larynx is negative for swelling or airway narrowing. No retropharyngeal abscess. Salivary glands: No inflammation, mass, or stone. Thyroid: Normal as permitted by  streak artifact Lymph nodes: Homogeneous nodal enlargement on the left more than right attributed to adenitis. No cavitation. Vascular: Negative for venous thrombosis Limited intracranial: Negative Visualized orbits: Negative Mastoids and visualized paranasal sinuses: Clear Skeleton: Negative Upper chest: Negative IMPRESSION: 1. Tonsillitis with 2 cm left peritonsillar abscess. 2. Cervical adenitis. Electronically Signed   By: Marnee SpringJonathon  Watts M.D.   On: 02/18/2019 09:44    Procedures Procedures (including critical care time)  Medications Ordered in ED Medications  Benzocaine (HURRCAINE) 20 % mouth spray (has no administration in time range)  lidocaine (XYLOCAINE) 2 % (with pres) injection 200 mg (has no administration in time range)  Ampicillin-Sulbactam (UNASYN) 3 g in sodium chloride 0.9 % 100 mL IVPB (0 g Intravenous Stopped 02/18/19 0919)  morphine 2 MG/ML injection 2 mg (2 mg Intravenous Given 02/18/19 0908)  ondansetron (ZOFRAN) injection 4 mg (4 mg Intravenous Given 02/18/19 0905)  sodium chloride 0.9 % bolus 1,000 mL (0 mLs Intravenous Stopped 02/18/19 1008)  iohexol (OMNIPAQUE) 300 MG/ML solution 100 mL (100 mLs Intravenous Contrast Given 02/18/19 0921)  dexamethasone (DECADRON) injection 10 mg (10 mg Intravenous Given 02/18/19 1051)     Initial Impression / Assessment and Plan / ED Course  I have reviewed the triage vital signs and the nursing notes.  Pertinent labs & imaging results that were available during my care of the patient were reviewed by me and considered in my medical decision making (see chart for details).        18 year old with history of recurrent tonsillitis who presents for persistent sore throat despite being on clindamycin.  On exam I am concerned about peritonsillar abscess.  Given that this is her third visit this month for sore throat, will obtain CT.  Will send throat swab.  We will also send mono.  Will check electrolytes and CBC.  Will give a dose of  Unasyn.  Will treat pain medications.  CT visualized by me noted to have a 2 cm peritonsillar abscess.  Patient is strep positive.  Patient with negative COVID, negative mono, normal white count normal electrolytes.  Given  peritonsillar abscess, discussed with ENT.  Dr. Merril AbbeMarcelino to come in and do bedside I&D.  Successful I&D at bedside by Dr. Merril AbbeMarcelino.  Patient to be admitted for IV antibiotics given the worsening symptoms while on clindamycin.  Patient aware of plan.  Final Clinical Impressions(s) / ED Diagnoses   Final diagnoses:  Peritonsillar abscess    ED Discharge Orders    None       Niel HummerKuhner, Nasya Vincent, MD 02/18/19 1232

## 2019-02-18 NOTE — Plan of Care (Signed)
  Problem: Health Behavior/Discharge Planning: Goal: Ability to manage health-related needs will improve 02/18/2019 1703 by Arlyss Repress, RN Outcome: Progressing 02/18/2019 1703 by Arlyss Repress, RN Outcome: Progressing

## 2019-02-18 NOTE — ED Notes (Signed)
Report called to floor, reports will send down tech to transport pt

## 2019-02-18 NOTE — ED Notes (Signed)
Patient transported to CT 

## 2019-02-19 MED ORDER — AMOXICILLIN-POT CLAVULANATE 875-125 MG PO TABS: 1.0000 | ORAL_TABLET | Freq: Two times a day (BID) | ORAL | 0 refills | Status: AC

## 2019-02-19 NOTE — Plan of Care (Signed)
  Problem: Health Behavior/Discharge Planning: Goal: Ability to manage health-related needs will improve Outcome: Progressing   Problem: Activity: Goal: Risk for activity intolerance will decrease Outcome: Progressing   

## 2019-02-19 NOTE — Progress Notes (Signed)
DISCHARGE NOTE HOME Samantha Morse to be discharged Home per MD order. Discussed prescriptions and follow up appointments with the patient. Prescriptions given to patient; medication list explained in detail. Patient verbalized understanding.  Skin clean, dry and intact without evidence of skin break down, no evidence of skin tears noted. IV catheter discontinued intact. Site without signs and symptoms of complications. Dressing and pressure applied. Pt denies pain at the site currently. No complaints noted.  Patient free of lines, drains, and wounds.   An After Visit Summary (AVS) was printed and given to the patient. Patient escorted via wheelchair, and discharged home via private auto.  Arlyss Repress, RN

## 2019-02-19 NOTE — Discharge Instructions (Signed)
RINSE INSTRUCTIONS  Irrigate your mouth with the following mixture 3 times daily for the next 5 days:  1/2 cup of warm water  1 teaspoon of salt  1/2 teaspoon of honey  Eat a soft diet for the next few days. No hard/crunchy foods or foods with sharp edges.  Your pain should continue to improve over the next couple of days.  Take tylenol and/or ibuprofen as needed for pain. Follow the package directions.  Take a probiotic, such as yogurt with active cultures, while taking your antibiotic. Take your antibiotic until the treatment is complete. Do not stop early.   Return to the ER if you experience any difficulty breathing or a significant bleeding event. Call the clinic if you have other concerns.   You should not return to work until Monday, 02/25/19. If your work requests any further documentation than seeing this note, please contact Dr. Trish Mage assistant, [place], [phone].

## 2019-02-19 NOTE — Discharge Summary (Signed)
Physician Discharge Summary  Patient ID: Samantha Morse MRN: [id number] DOB/AGE: 2001/07/22 18 y.o.  Admit date: 02/18/2019 Discharge date: 02/19/2019  Admission Diagnoses:  Discharge Diagnoses:  Active Problems:   Peritonsillar abscess   Discharged Condition: good  Hospital Course: The patient presented to the emergency department on 02/18/2019.  She was having severe throat pain on the left side and ear pain.  She has struggled with snoring as well as recurrent strep throat episodes.  This was her first peritonsillar abscess.  She had a little mild trismus.  She had a CT scan which demonstrated a multiloculated left peritonsillar abscess.  This was drained in the emergency department.  She was placed on IV Unasyn.  Culture results grew gram-positive cocci.  On postop day 1 she had resolution of her trismus.  Her pain was greatly improved.  She was able to tolerate a near regular diet.  She is afebrile.  She is ambulating.  Pain controlled, voice normal, no stridor.  She was discharged with strict return precautions.  We will continue to follow culture results.  She was advised to stop taking clindamycin which she had been on at home.  She was written a prescription for 10 days of Augmentin.  Consults: ENT  Significant Diagnostic Studies: microbiology: wound culture: positive for grasm positive cocci and CT neck w contrast  Treatments: I&D left PTA, IV antibiotics: Unasyn  Discharge Exam: Blood pressure 137/83, pulse 82, temperature 98.8 F (37.1 C), temperature source Oral, resp. rate 18, height 5\' 4"  (1.626 m), weight (!) 141.7 kg, SpO2 98 %. General appearance: alert, cooperative, appears stated age and no distress Eyes: conjunctivae/corneas clear. PERRL, EOM's intact. Fundi benign. Ears: normal TM's and external ear canals both ears Nose: Nares normal. Septum midline. Mucosa normal. No drainage or sinus tenderness. Throat: lips, mucosa, and tongue normal; teeth and gums normal and  left PTA incision patent, no drainage, no trismus, normal voice quality Neck: mild anterior cervical adenopathy, no adenopathy, supple, symmetrical, trachea midline and thyroid not enlarged, symmetric, no tenderness/mass/nodules Cardio: regular rate and rhythm, S1, S2 normal, no murmur, click, rub or gallop GI: soft, non-tender; bowel sounds normal; no masses,  no organomegaly Extremities: extremities normal, atraumatic, no cyanosis or edema Skin: Skin color, texture, turgor normal. No rashes or lesions  Disposition: Discharge disposition: 01-Home or Self Care       Discharge Instructions    Call MD for:  difficulty breathing, headache or visual disturbances   Complete by: As directed    Call MD for:  persistant nausea and vomiting   Complete by: As directed    Call MD for:  redness, tenderness, or signs of infection (pain, swelling, redness, odor or green/yellow discharge around incision site)   Complete by: As directed    Call MD for:  temperature >100.4   Complete by: As directed    Diet general   Complete by: As directed    Soft diet for one week     Allergies as of 02/19/2019      Reactions   Bee Pollen    Other reaction(s): Other (See Comments) REDNESS OF EYES, STUFFY NOSE   Pollen Extract Other (See Comments)   REDNESS OF EYES, STUFFY NOSE      Medication List    STOP taking these medications   clindamycin 150 MG capsule Commonly known as: CLEOCIN     TAKE these medications   acetaminophen 325 MG tablet Commonly known as: TYLENOL Take 650 mg by mouth every  6 (six) hours as needed for mild pain.   albuterol 108 (90 Base) MCG/ACT inhaler Commonly known as: VENTOLIN HFA Inhale 1-2 puffs into the lungs every 6 (six) hours as needed for wheezing or shortness of breath.   amoxicillin-clavulanate 875-125 MG tablet Commonly known as: Augmentin Take 1 tablet by mouth 2 (two) times daily for 10 days.   ibuprofen 200 MG tablet Commonly known as: ADVIL Take 600 mg  by mouth every 6 (six) hours as needed for mild pain.   loratadine 10 MG tablet Commonly known as: CLARITIN Take 10 mg by mouth daily as needed for allergies.   Norgestimate-Ethinyl Estradiol Triphasic 0.18/0.215/0.25 MG-25 MCG tab Take 1 tablet by mouth daily.      Follow-up Information    Graylin ShiverMarcellino, Amanda J, MD. Schedule an appointment as soon as possible for a visit on 02/27/2019.   Specialty: Otolaryngology Contact information: [address] [place] [address] [phone]           Signed: Graylin Shivermanda J Marcellino 02/19/2019, 12:07 PM

## 2019-02-21 LAB — AEROBIC CULTURE W GRAM STAIN (SUPERFICIAL SPECIMEN): Culture: NORMAL

## 2019-03-07 ENCOUNTER — Other Ambulatory Visit: Payer: Self-pay | Admitting: Otolaryngology

## 2019-03-07 NOTE — Pre-Procedure Instructions (Signed)
STARLETT PEHRSON  03/07/2019     Your procedure is scheduled on  Friday, July 17..  Report to [facility], Main Entrance or Entrance "A" at 6:30 AM                       Your surgery or procedure is scheduled for 8:30 A.M.  Call this number if you have problems the morning of surgery: [phone]  This is the number for the Pre- Surgical Desk.         For any other questions, please call [phone], Monday - Friday 8 AM - 4 PM.    Remember:  Do not eat or drink after midnight Thursday, July 16.               Take these medicines the morning of surgery with A SIP OF WATER:                 Norgestimate-Ethinyl Estradiol Triphasic                 If needed:               acetaminophen (TYLENOL               loratadine (CLARITIN)               albuterol (PROVENTIL HFA;VENTOLIN HFA) inhaler Please bring it to the hospital with you.  1 Week prior to surgery STOP/ DO Not Start taking Aspirin, Aspirin Products (Goody Powder, Excedrin Migraine), Ibuprofen (Advil), Naproxen (Aleve), Vitamins and Herbal Products (ie Fish Oil).  Special instructions: If you smoke, Do Not Smoke within 24 hours prior to surgery               [facility]- Preparing For Surgery  Before surgery, you can play an important role. Because skin is not sterile, your skin needs to be as free of germs as possible. You can reduce the number of germs on your skin by washing with CHG (chlorahexidine gluconate) Soap before surgery.  CHG is an antiseptic cleaner which kills germs and bonds with the skin to continue killing germs even after washing.    Oral Hygiene is also important to reduce your risk of infection.  Remember - BRUSH YOUR TEETH THE MORNING OF SURGERY WITH YOUR REGULAR TOOTHPASTE  Please do not use if you have an allergy to CHG or antibacterial soaps. If your skin becomes reddened/irritated stop using the CHG.  Do not shave (including legs and underarms) for at least 48 hours prior to first CHG shower.  It is OK to shave your face.  Please follow these instructions carefully.   1. Shower the NIGHT BEFORE SURGERY and the MORNING OF SURGERY with CHG.   2. If you chose to wash your hair, wash your hair first as usual with your normal shampoo.  3. After you shampoo, wash your face and private area with the soap you use at home, then rinse your hair and body thoroughly to remove the shampoo and soap.  4. Use CHG as you would any other liquid soap. You can apply CHG directly to the skin and wash gently with a scrungie or a clean washcloth.   5. Apply the CHG Soap to your body ONLY FROM THE NECK DOWN.  Do not use on open wounds or open sores. Avoid contact with your eyes, ears, mouth and genitals (private parts).  6. Wash thoroughly, paying special attention to  the area where your surgery will be performed.  7. Thoroughly rinse your body with warm water from the neck down.  8. DO NOT shower/wash with your normal soap after using and rinsing off the CHG Soap.  9. Pat yourself dry with a CLEAN TOWEL.  10. Wear CLEAN PAJAMAS to bed the night before surgery, wear comfortable clothes the morning of surgery  11. Place CLEAN SHEETS on your bed the night of your first shower and DO NOT SLEEP WITH PETS.  Day of Surgery: Shower as stated above Do not wear lotions, powders, or perfumes, or deodorant. Please wear clean clothes to the hospital/surgery center.   Remember to brush your teeth WITH YOUR REGULAR TOOTHPASTE.  Do not wear jewelry, make-up or nail polish.  Do not shave 48 hours prior to surgery.    Do not bring valuables to the hospital.  [facility] is not [facility] for any belongings or valuables.  Contacts, dentures or bridgework may not be worn into surgery.  Leave your suitcase in the car.  After surgery it may be brought to your room.  For patients admitted to the hospital, discharge time will be determined by your treatment team.  Patients discharged the day of surgery will not  be allowed to drive home.    Please read over the following fact sheets that you were given: 10 Things YOU CAN DO TO [facility]

## 2019-03-08 ENCOUNTER — Encounter (HOSPITAL_COMMUNITY)
Admission: RE | Admit: 2019-03-08 | Discharge: 2019-03-08 | Disposition: A | Discharge status: Home / Self Care | Payer: Medicaid Other | Source: Ambulatory Visit | Attending: Otolaryngology | Admitting: Otolaryngology

## 2019-03-08 ENCOUNTER — Encounter (HOSPITAL_COMMUNITY): Payer: Self-pay

## 2019-03-08 ENCOUNTER — Other Ambulatory Visit: Payer: Self-pay

## 2019-03-08 ENCOUNTER — Other Ambulatory Visit: Payer: Self-pay | Admitting: Otolaryngology

## 2019-03-08 DIAGNOSIS — Z01812 Encounter for preprocedural laboratory examination: Secondary | ICD-10-CM | POA: Diagnosis present

## 2019-03-08 DIAGNOSIS — J351 Hypertrophy of tonsils: Secondary | ICD-10-CM

## 2019-03-08 HISTORY — DX: Obesity, unspecified: E66.9

## 2019-03-08 HISTORY — DX: Nausea with vomiting, unspecified: R11.2

## 2019-03-08 HISTORY — DX: Other specified postprocedural states: Z98.890

## 2019-03-08 LAB — CBC
HCT: 39 % (ref 36.0–46.0)
Hemoglobin: 12.7 g/dL (ref 12.0–15.0)
MCH: 29.5 pg (ref 26.0–34.0)
MCHC: 32.6 g/dL (ref 30.0–36.0)
MCV: 90.5 fL (ref 80.0–100.0)
Platelets: 293 10*3/uL (ref 150–400)
RBC: 4.31 MIL/uL (ref 3.87–5.11)
RDW: 12.4 % (ref 11.5–15.5)
WBC: 7.2 10*3/uL (ref 4.0–10.5)
nRBC: 0 % (ref 0.0–0.2)

## 2019-03-08 NOTE — Progress Notes (Signed)
Patient informed of the [place] that is currently in effect.  Patient verbalized understanding.  Patient denies shortness of breath, fever, cough and chest pain at PAT appointment  PCP - Dr Standley Dakins Child Health Cardiologist - Denies  Chest x-ray - Denies EKG - Denies Stress Test - Denies ECHO - Denies Cardiac Cath - Denies  Coronavirus Screening Have you or a family member experienced the following symptoms:  Cough yes/no: No Fever (>100.57F)  yes/no: No Runny nose yes/no: No Sore throat yes/no: No Difficulty breathing/shortness of breath  yes/no: No  Have you or a family member traveled in the last 14 days and where? yes/no: No

## 2019-03-12 ENCOUNTER — Other Ambulatory Visit (HOSPITAL_COMMUNITY)
Admission: RE | Admit: 2019-03-12 | Discharge: 2019-03-12 | Disposition: A | Discharge status: Home / Self Care | Payer: Medicaid Other | Source: Ambulatory Visit | Attending: Otolaryngology | Admitting: Otolaryngology

## 2019-03-12 DIAGNOSIS — Z1159 Encounter for screening for other viral diseases: Secondary | ICD-10-CM | POA: Insufficient documentation

## 2019-03-12 LAB — SARS CORONAVIRUS 2 (TAT 6-24 HRS): SARS Coronavirus 2: NEGATIVE

## 2019-03-14 NOTE — Anesthesia Preprocedure Evaluation (Addendum)
Anesthesia Evaluation  Patient identified by MRN, date of birth, ID band Patient awake    Reviewed: Allergy & Precautions, NPO status , Patient's Chart, lab work & pertinent test results  History of Anesthesia Complications (+) PONV and history of anesthetic complications  Airway Mallampati: II  TM Distance: >3 FB Neck ROM: Full    Dental no notable dental hx. (+) Dental Advisory Given   Pulmonary asthma , Current Smoker,    Pulmonary exam normal        Cardiovascular negative cardio ROS Normal cardiovascular exam     Neuro/Psych negative neurological ROS     GI/Hepatic negative GI ROS, Neg liver ROS,   Endo/Other  Morbid obesity  Renal/GU negative Renal ROS     Musculoskeletal negative musculoskeletal ROS (+)   Abdominal   Peds  Hematology negative hematology ROS (+)   Anesthesia Other Findings Day of surgery medications reviewed with the patient.  Reproductive/Obstetrics                            Anesthesia Physical Anesthesia Plan  ASA: III  Anesthesia Plan: General   Post-op Pain Management:    Induction: Intravenous, Rapid sequence and Cricoid pressure planned  PONV Risk Score and Plan: 4 or greater and Ondansetron, Dexamethasone, Scopolamine patch - Pre-op and Diphenhydramine  Airway Management Planned: Oral ETT  Additional Equipment:   Intra-op Plan:   Post-operative Plan: Extubation in OR  Informed Consent: I have reviewed the patients History and Physical, chart, labs and discussed the procedure including the risks, benefits and alternatives for the proposed anesthesia with the patient or authorized representative who has indicated his/her understanding and acceptance.     Dental advisory given  Plan Discussed with: CRNA and Anesthesiologist  Anesthesia Plan Comments:        Anesthesia Quick Evaluation

## 2019-03-15 ENCOUNTER — Inpatient Hospital Stay (HOSPITAL_COMMUNITY): Payer: Medicaid Other | Admitting: Certified Registered Nurse Anesthetist

## 2019-03-15 ENCOUNTER — Encounter (HOSPITAL_COMMUNITY): Payer: Self-pay

## 2019-03-15 ENCOUNTER — Encounter (HOSPITAL_COMMUNITY)
Admission: RE | Disposition: A | Discharge status: Home / Self Care | Payer: Self-pay | Source: Home / Self Care | Attending: Otolaryngology

## 2019-03-15 ENCOUNTER — Ambulatory Visit (HOSPITAL_COMMUNITY)
Admission: RE | Admit: 2019-03-15 | Discharge: 2019-03-16 | Disposition: A | Discharge status: Home / Self Care | Payer: Medicaid Other | Attending: Otolaryngology | Admitting: Otolaryngology

## 2019-03-15 ENCOUNTER — Other Ambulatory Visit: Payer: Self-pay

## 2019-03-15 DIAGNOSIS — G4730 Sleep apnea, unspecified: Secondary | ICD-10-CM | POA: Diagnosis not present

## 2019-03-15 DIAGNOSIS — J3501 Chronic tonsillitis: Secondary | ICD-10-CM | POA: Diagnosis not present

## 2019-03-15 DIAGNOSIS — Z6841 Body Mass Index (BMI) 40.0 and over, adult: Secondary | ICD-10-CM | POA: Insufficient documentation

## 2019-03-15 DIAGNOSIS — J351 Hypertrophy of tonsils: Secondary | ICD-10-CM

## 2019-03-15 DIAGNOSIS — J45909 Unspecified asthma, uncomplicated: Secondary | ICD-10-CM | POA: Diagnosis not present

## 2019-03-15 DIAGNOSIS — F17210 Nicotine dependence, cigarettes, uncomplicated: Secondary | ICD-10-CM | POA: Diagnosis not present

## 2019-03-15 HISTORY — PX: TONSILLECTOMY: SUR1361

## 2019-03-15 HISTORY — PX: TONSILLECTOMY: SHX5217

## 2019-03-15 LAB — POCT PREGNANCY, URINE: Preg Test, Ur: NEGATIVE

## 2019-03-15 SURGERY — TONSILLECTOMY
Anesthesia: General | Laterality: Bilateral

## 2019-03-15 MED ORDER — ONDANSETRON HCL 4 MG/2ML IJ SOLN
INTRAMUSCULAR | Status: AC
  Filled 2019-03-15: qty 2

## 2019-03-15 MED ORDER — MIDAZOLAM HCL 5 MG/5ML IJ SOLN
INTRAMUSCULAR | Status: DC | PRN
  Administered 2019-03-15: 2 mg via INTRAVENOUS

## 2019-03-15 MED ORDER — LIDOCAINE 2% (20 MG/ML) 5 ML SYRINGE
INTRAMUSCULAR | Status: DC | PRN
  Administered 2019-03-15: 100 mg via INTRAVENOUS

## 2019-03-15 MED ORDER — DEXAMETHASONE SODIUM PHOSPHATE 10 MG/ML IJ SOLN
INTRAMUSCULAR | Status: DC | PRN
  Administered 2019-03-15: 10 mg via INTRAVENOUS

## 2019-03-15 MED ORDER — ACETAMINOPHEN 325 MG PO TABS: 650.0000 mg | ORAL_TABLET | Freq: Four times a day (QID) | ORAL | Status: DC

## 2019-03-15 MED ORDER — LACTATED RINGERS IV SOLN
INTRAVENOUS | Status: DC | PRN
  Administered 2019-03-15: 08:00:00 via INTRAVENOUS

## 2019-03-15 MED ORDER — FENTANYL CITRATE (PF) 250 MCG/5ML IJ SOLN
INTRAMUSCULAR | Status: DC | PRN
  Administered 2019-03-15: 50 ug via INTRAVENOUS
  Administered 2019-03-15 (×2): 25 ug via INTRAVENOUS

## 2019-03-15 MED ORDER — LACTATED RINGERS IV SOLN
INTRAVENOUS | Status: DC
  Administered 2019-03-15: 07:00:00 via INTRAVENOUS

## 2019-03-15 MED ORDER — FENTANYL CITRATE (PF) 250 MCG/5ML IJ SOLN
INTRAMUSCULAR | Status: AC
  Filled 2019-03-15: qty 5

## 2019-03-15 MED ORDER — PROPOFOL 10 MG/ML IV BOLUS
INTRAVENOUS | Status: AC
  Filled 2019-03-15: qty 20

## 2019-03-15 MED ORDER — ONDANSETRON HCL 4 MG/2ML IJ SOLN
INTRAMUSCULAR | Status: DC | PRN
  Administered 2019-03-15: 4 mg via INTRAVENOUS

## 2019-03-15 MED ORDER — ACETAMINOPHEN 500 MG PO TABS
1000.0000 mg | ORAL_TABLET | Freq: Once | ORAL | Status: AC
  Administered 2019-03-15: 07:00:00 1000 mg via ORAL
  Filled 2019-03-15: qty 2

## 2019-03-15 MED ORDER — DEXMEDETOMIDINE HCL 200 MCG/2ML IV SOLN
INTRAVENOUS | Status: DC | PRN
  Administered 2019-03-15: 4 ug via INTRAVENOUS
  Administered 2019-03-15: 8 ug via INTRAVENOUS

## 2019-03-15 MED ORDER — SCOPOLAMINE 1 MG/3DAYS TD PT72
1.0000 | MEDICATED_PATCH | TRANSDERMAL | Status: DC
  Administered 2019-03-15: 1.5 mg via TRANSDERMAL
  Filled 2019-03-15: qty 1

## 2019-03-15 MED ORDER — LIDOCAINE 2% (20 MG/ML) 5 ML SYRINGE
INTRAMUSCULAR | Status: AC
  Filled 2019-03-15: qty 5

## 2019-03-15 MED ORDER — PHENOL 1.4 % MT LIQD: 1.0000 | OROMUCOSAL | Status: DC | PRN

## 2019-03-15 MED ORDER — SUCCINYLCHOLINE CHLORIDE 200 MG/10ML IV SOSY
PREFILLED_SYRINGE | INTRAVENOUS | Status: DC | PRN
  Administered 2019-03-15: 180 mg via INTRAVENOUS

## 2019-03-15 MED ORDER — DEXTROSE-NACL 5-0.9 % IV SOLN
INTRAVENOUS | Status: DC
  Administered 2019-03-15: 11:00:00 via INTRAVENOUS

## 2019-03-15 MED ORDER — POLYETHYLENE GLYCOL 3350 17 G PO PACK: 17.0000 g | PACK | Freq: Every day | ORAL | Status: DC | PRN

## 2019-03-15 MED ORDER — MIDAZOLAM HCL 2 MG/2ML IJ SOLN
INTRAMUSCULAR | Status: AC
  Filled 2019-03-15: qty 2

## 2019-03-15 MED ORDER — ALBUTEROL SULFATE (2.5 MG/3ML) 0.083% IN NEBU: 3.0000 mL | INHALATION_SOLUTION | Freq: Four times a day (QID) | RESPIRATORY_TRACT | Status: DC | PRN

## 2019-03-15 MED ORDER — FENTANYL CITRATE (PF) 100 MCG/2ML IJ SOLN
25.0000 ug | INTRAMUSCULAR | Status: DC | PRN
  Administered 2019-03-15 (×4): 25 ug via INTRAVENOUS

## 2019-03-15 MED ORDER — PROMETHAZINE HCL 25 MG/ML IJ SOLN: 6.2500 mg | INTRAMUSCULAR | Status: DC | PRN

## 2019-03-15 MED ORDER — IBUPROFEN 200 MG PO TABS: 600.0000 mg | ORAL_TABLET | Freq: Four times a day (QID) | ORAL | 0 refills | Status: DC

## 2019-03-15 MED ORDER — PROPOFOL 10 MG/ML IV BOLUS
INTRAVENOUS | Status: DC | PRN
  Administered 2019-03-15: 200 mg via INTRAVENOUS
  Administered 2019-03-15 (×2): 50 mg via INTRAVENOUS

## 2019-03-15 MED ORDER — SCOPOLAMINE 1 MG/3DAYS TD PT72
MEDICATED_PATCH | TRANSDERMAL | Status: AC
  Filled 2019-03-15: qty 1

## 2019-03-15 MED ORDER — ROCURONIUM BROMIDE 10 MG/ML (PF) SYRINGE
PREFILLED_SYRINGE | INTRAVENOUS | Status: AC
  Filled 2019-03-15: qty 10

## 2019-03-15 MED ORDER — OXYCODONE HCL 5 MG/5ML PO SOLN: 5.0000 mg | ORAL | 0 refills | Status: DC | PRN

## 2019-03-15 MED ORDER — ACETAMINOPHEN 325 MG PO TABS
650.0000 mg | ORAL_TABLET | Freq: Four times a day (QID) | ORAL | Status: DC
  Administered 2019-03-15 – 2019-03-16 (×5): 650 mg via ORAL
  Filled 2019-03-15 (×5): qty 2

## 2019-03-15 MED ORDER — LORATADINE 10 MG PO TABS: 10.0000 mg | ORAL_TABLET | Freq: Every day | ORAL | Status: DC | PRN

## 2019-03-15 MED ORDER — FENTANYL CITRATE (PF) 100 MCG/2ML IJ SOLN
INTRAMUSCULAR | Status: AC
  Administered 2019-03-15: 25 ug via INTRAVENOUS
  Filled 2019-03-15: qty 2

## 2019-03-15 MED ORDER — DEXAMETHASONE SODIUM PHOSPHATE 10 MG/ML IJ SOLN
INTRAMUSCULAR | Status: AC
  Filled 2019-03-15: qty 1

## 2019-03-15 MED ORDER — IBUPROFEN 600 MG PO TABS
600.0000 mg | ORAL_TABLET | Freq: Four times a day (QID) | ORAL | Status: DC
  Administered 2019-03-15 – 2019-03-16 (×4): 600 mg via ORAL
  Filled 2019-03-15 (×4): qty 1

## 2019-03-15 SURGICAL SUPPLY — 32 items
CANISTER SUCT 3000ML PPV (MISCELLANEOUS) ×2 IMPLANT
CATH ROBINSON RED A/P 10FR (CATHETERS) IMPLANT
CLEANER TIP ELECTROSURG 2X2 (MISCELLANEOUS) ×2 IMPLANT
COAGULATOR SUCT SWTCH 10FR 6 (ELECTROSURGICAL) ×2 IMPLANT
COVER WAND RF STERILE (DRAPES) ×2 IMPLANT
DRAPE HALF SHEET 40X57 (DRAPES) IMPLANT
ELECT COATED BLADE 2.86 ST (ELECTRODE) ×2 IMPLANT
ELECT REM PT RETURN 9FT ADLT (ELECTROSURGICAL)
ELECT REM PT RETURN 9FT PED (ELECTROSURGICAL)
ELECTRODE REM PT RETRN 9FT PED (ELECTROSURGICAL) IMPLANT
ELECTRODE REM PT RTRN 9FT ADLT (ELECTROSURGICAL) IMPLANT
GAUZE 4X4 16PLY RFD (DISPOSABLE) ×2 IMPLANT
GLOVE BIO SURGEON STRL SZ 6.5 (GLOVE) ×2 IMPLANT
GOWN STRL REUS W/ TWL LRG LVL3 (GOWN DISPOSABLE) ×2 IMPLANT
GOWN STRL REUS W/TWL LRG LVL3 (GOWN DISPOSABLE) ×4
KIT BASIN OR (CUSTOM PROCEDURE TRAY) ×2 IMPLANT
KIT TURNOVER KIT B (KITS) ×2 IMPLANT
NDL HYPO 25GX1X1/2 BEV (NEEDLE) IMPLANT
NEEDLE HYPO 25GX1X1/2 BEV (NEEDLE) IMPLANT
NS IRRIG 1000ML POUR BTL (IV SOLUTION) ×2 IMPLANT
PACK SURGICAL SETUP 50X90 (CUSTOM PROCEDURE TRAY) ×2 IMPLANT
PAD ARMBOARD 7.5X6 YLW CONV (MISCELLANEOUS) ×2 IMPLANT
PENCIL BUTTON HOLSTER BLD 10FT (ELECTRODE) ×2 IMPLANT
POSITIONER HEAD DONUT 9IN (MISCELLANEOUS) IMPLANT
SPECIMEN JAR SMALL (MISCELLANEOUS) ×4 IMPLANT
SPONGE TONSIL TAPE 1.25 RFD (DISPOSABLE) ×2 IMPLANT
SYR BULB 3OZ (MISCELLANEOUS) IMPLANT
TOWEL GREEN STERILE FF (TOWEL DISPOSABLE) ×2 IMPLANT
TUBE CONNECTING 12X1/4 (SUCTIONS) ×2 IMPLANT
TUBE SALEM SUMP 14F W/ARV (TUBING) IMPLANT
TUBE SALEM SUMP 16 FR W/ARV (TUBING) ×2 IMPLANT
YANKAUER SUCT BULB TIP NO VENT (SUCTIONS) ×2 IMPLANT

## 2019-03-15 NOTE — Discharge Instructions (Signed)
[facility] Misty Stanley, MD Phone: [phone]   Tonsillectomy Aftercare Instructions  Most children will experience some ear and throat pain for up to 2 weeks after a tonsillectomy. They usually have good days and bad days. A low grade fever up to 101F is common on the day of surgery and the next day.   You may a very small amount of blood in your child's saliva on the first day or two after surgery.  There will be white scabs where the tonsils were, sometimes accompanied by little black spots. This is from the technique used to remove the tonsils and the way that the body heals this area.  These scabs usually fall off in 5 to 10 days.  Your child may also have bad breath for 2 weeks.   Your child may snore or breathe through his or her mouth at night. This usually stops in a week or two. The mouth- breathing can cause mouth dryness and pain. The use of an air humidifier next to your child's bed or close to your child may be helpful. Be sure to follow the directions for cleaning the machine.   Your child's voice may also sound odd after surgery, but should return to normal in 2 to 3 weeks.   Many children, even thin ones, tend to lose a little weight after the surgery. As long as your child is drinking liquids, this is okay. Your child will probably gain the weight back in 2 to 3 weeks.   This care sheet gives you a general idea about how long it will take for your child to recover. But each child recovers at a different pace. Follow the steps below to help your child get better as quickly as possible.   How can you care for your child at home?  Activity  Your child may want to spend the first few days in bed. When your child is ready, he or she can begin playing again. Encourage quiet indoor play for the first 3 to 5 days.  Your child will probably be able to go back to school or day care in 7 to 10 days. He or  she should not go to gym or PE class for about 2 weeks or until your doctor says it is okay.  For about two weeks, let your child pace their activities themselves.  They should avoid coached sports, swimming, wrestling with siblings, being tickled, or other physical situations where they are not in control of the effort they need to exert. For about 7 days, keep your child away from crowds or people that you know who have a cold or the flu. This can help prevent your child from getting an infection.  You and your child should stay close to medical care for about 2 weeks in case there is delayed bleeding.  Your child may bathe as usual.  Diet  Have your child drink plenty of fluids to avoid becoming dehydrated. Use clear fluids, such as water, apple juice, and flavored ice pops. Avoid hot drinks, soda pop, and citrus juices, such as orange juice. These may cause more pain.  When your child is ready to eat, start with easy-to-swallow foods. These include soft noodles, pudding, and dairy foods such as yogurt and ice cream. Dairy foods may cause the saliva to thicken, making it hard to swallow. Try them in small amounts. Canned or cooked fruit, scrambled eggs, and mashed potatoes  are other good choices.  You may notice a change in your child's bowel habits right after surgery. This is common. If your child has not had a bowel movement after a couple of days, call your doctor.  Medicines & Pain Control See that your child takes pain medicines exactly as directed.  In most children, we recommend treating pain with alternating doses of Tylenol and ibuprofen as directed on the bottle according to their weight.   If this does not control pain to where your child can drink and sleep some, then try the Oxycodone prescription. Example of alternating 6 hour doses:   9AM  Acetaminophen (Tylenol)   Noon Ibuprofen (Pediaprofen, Advil)   3PM Acetaminophen (Tylenol)   6PM Ibuprofen (Pediaprofen, Advil) Do not give  aspirin to anyone younger than 20. It has been linked to Reye syndrome, a serious illness.  If you think the pain medicine is making your child sick to his or her stomach:  Give the medicine after meals (unless your doctor has told you not to).  Ask your doctor for a different pain medicine.   When should you call for help?  Call 911 anytime you think your child may need emergency care. For example, call if:  Your child passes out (loses consciousness).  Your child has trouble breathing.  Go to the emergency room if:  Your child bleeds from the mouth or nose. If you child bleeds more than a small teaspoon, go to the ER immediately.  Call your doctor if:  Your child has signs of infection, such as:  Increased pain, swelling, warmth, or redness.  Pus draining from the area.  Swollen lymph nodes in the neck, armpits, or groin.  A fever.  Your child has a fever over 101.5F that will not come down, even if he or she drinks fluids or takes medicine.  Your child has signs of needing more fluids. These signs include sunken eyes with few tears, a dry mouth with little or no spit, and little or no urine for 8 or more hours.  Your child will not drink liquids the day after surgery.     

## 2019-03-15 NOTE — Op Note (Signed)
DATE OF PROCEDURE:  03/15/2019    PRE-OPERATIVE DIAGNOSIS:  CHRONIC TONSILLITIS, SLEEP DISORDERED BREATHING    POST-OPERATIVE DIAGNOSIS:  Same    PROCEDURE(S): Tonsillectomy   SURGEON: Gavin Pound, MD    ASSISTANT(S): none    ANESTHESIA: General endotracheal anesthesia       ESTIMATED BLOOD LOSS: 15 mL   SPECIMENS: Right tonsillectomy Left tonsillectomy    COMPLICATIONS:  None    OPERATIVE FINDINGS: There was no submucosal cleft palate.  The tonsils were 3+ bilaterally and cryptic, the left appeared to be slightly more scarred than the right.  The adenoid bed had, as expected, regressed and there was no significant adenoid hypertrophy.     OPERATIVE DETAILS: With the patient in a comfortable supine position,  general orotracheal anesthesia was induced without difficulty.  A routine surgical timeout was performed.   The bed was turned 90 away from anesthesia and placed in Trendelenburg.  A clean preparation and draping was accomplished.  Taking care to protect lips, teeth, and endotracheal tube, the Crowe-Davis mouth gag was introduced, expanded for visualization, and suspended from the Lincoln stand in the standard fashion.  A red rubber catheter was inserted into the left naris and utilized to suspend the soft palate.  The findings were as described above.  Palate retractor and mirror were used to examine the nasopharynx with the findings as described above.     Beginning on the right side, the tonsil was grasped and retracted medially.  The mucosa over the anterior and superior poles was coagulated and then cut down to the capsule of the tonsil using Bovie electrocautery.  Crossing vessels were coagulated as identified.  The tonsil was removed in its entirety as determined by examination of both tonsil and fossa.  A small additional quantity of cautery rendered the fossa hemostatic.  An identical procedure was performed on the opposite side.  Hemostasis  was achieved with cautery.     At this point the palate retractor and mouthgag were relaxed for several minutes.  Upon reexpansion,  hemostasis was observed.  An orogastric tube was briefly placed and a small amount of clear secretions was evacuated.  This tube was removed.  The mouth gag and palate retractor were relaxed and removed.  The dental status was intact.   At this point the procedure was completed.  The patient was returned to anesthesia, awakened, extubated, and transferred to recovery in stable condition.

## 2019-03-15 NOTE — Anesthesia Postprocedure Evaluation (Signed)
Anesthesia Post Note  Patient: Samantha Morse  Procedure(s) Performed: TONSILLECTOMY (Bilateral )     Patient location during evaluation: PACU Anesthesia Type: General Level of consciousness: sedated Pain management: pain level controlled Vital Signs Assessment: post-procedure vital signs reviewed and stable Respiratory status: spontaneous breathing and respiratory function stable Cardiovascular status: stable Postop Assessment: no apparent nausea or vomiting Anesthetic complications: no    Last Vitals:  Vitals:   03/15/19 1030 03/15/19 1034  BP:    Pulse: 88 84  Resp: 18 (!) 23  Temp: (!) 36.1 C   SpO2: 98% 99%                   Anahis Furgeson DANIEL

## 2019-03-15 NOTE — Transfer of Care (Signed)
Immediate Anesthesia Transfer of Care Note  Patient: Samantha Morse  Procedure(s) Performed: TONSILLECTOMY (Bilateral )  Patient Location: PACU  Anesthesia Type:General  Level of Consciousness: drowsy  Airway & Oxygen Therapy: Patient Spontanous Breathing and Patient connected to face mask oxygen  Post-op Assessment: Report given to RN and Post -op Vital signs reviewed and stable  Post vital signs: Reviewed and stable  Last Vitals:  Vitals Value Taken Time  BP 135/72 03/15/19 0939  Temp    Pulse 78 03/15/19 0940  Resp 16 03/15/19 0940  SpO2 100 % 03/15/19 0940  Vitals shown include unvalidated device data.  Last Pain:  Vitals:   03/15/19 0657  PainSc: 0-No pain         Complications: No apparent anesthesia complications

## 2019-03-15 NOTE — H&P (Signed)
  The surgical history remains accurate and without interval change. The condition still exists which makes the procedure necessary. The patient and/or family is aware of their condition and has been informed of the risks and benefits of surgery, as well as alternatives. All parties have elected to proceed with surgery.   Surgical plan: Tonsillectomy, possible adenoidectomy  Otolaryngology Return Visit  Carson F Laboy underwent left-sided PTA drainage along with admission on IV Unasyn at [facility] on 02/18/19. She has done well on Augmentin. She states that trismus has resolved, sore throat resolved. She feels completely normal. No recent fevers.  Physical Examination: Alert awake oriented. Morbidly obese. There is no trismus, dentition is intact, uvula is midline, tonsil swelling nearly resolved. There is a left PTA incision which is almost close completely. Neck is soft.  Assessment/Plan: Patient with recurrent tonsillitis, recent PTA, along with snoring and some signs of sleep disordered breathing in the setting of morbid obesity. We have recommended tonsillectomy possible adenoidectomy in the past. Risks and benefits of this have been discussed. Postoperative care again discussed today. We will get this on the schedule in the next couple of weeks. She understands that she will need to self isolate a week before surgery. She will be out of work for 2 weeks after surgery. She will stay one night in the hospital postoperatively.

## 2019-03-15 NOTE — Anesthesia Procedure Notes (Signed)
Procedure Name: Intubation Performed by: Milford Cage, CRNA Pre-anesthesia Checklist: Patient identified, Emergency Drugs available, Suction available and Patient being monitored Patient Re-evaluated:Patient Re-evaluated prior to induction Oxygen Delivery Method: Circle System Utilized Preoxygenation: Pre-oxygenation with 100% oxygen Induction Type: IV induction and Rapid sequence Laryngoscope Size: Mac and 3 Grade View: Grade I Tube type: Oral Tube size: 7.0 mm Number of attempts: 1 Airway Equipment and Method: Stylet and Oral airway Placement Confirmation: ETT inserted through vocal cords under direct vision,  positive ETCO2 and breath sounds checked- equal and bilateral Secured at: 22 cm Tube secured with: Tape Dental Injury: Teeth and Oropharynx as per pre-operative assessment

## 2019-03-16 ENCOUNTER — Encounter (HOSPITAL_COMMUNITY): Payer: Self-pay | Admitting: Otolaryngology

## 2019-03-16 DIAGNOSIS — J3501 Chronic tonsillitis: Secondary | ICD-10-CM | POA: Diagnosis not present

## 2019-03-16 NOTE — Progress Notes (Signed)
Pt discharged home in stable condition after going over discharge teaching with no concerns voiced 

## 2019-03-16 NOTE — Discharge Summary (Signed)
Physician Discharge Summary  Patient ID: Samantha Morse MRN: [id number] DOB/AGE: [birth year] 18 y.o.  Admit date: 03/15/2019 Discharge date: 03/16/2019  Admission Diagnoses:chronic tonsillitis  Discharge Diagnoses:  Active Problems:   Sleep disorder breathing   Discharged Condition: good  Hospital Course: no complications, taking po well  Consults: none  Significant Diagnostic Studies: none  Treatments: surgery: Tonsillectomy  Discharge Exam: Blood pressure 140/83, pulse 75, temperature 98.2 F (36.8 C), temperature source Oral, resp. rate 18, height 5\' 4"  (1.626 m), weight (!) 140.6 kg, last menstrual period 02/21/2019, SpO2 100 %. PHYSICAL EXAM: She is awake and alert, breathing easily.  She is taking good p.o. liquids.  Pain is reasonably well controlled.  Disposition: Discharge disposition: 01-Home or Self Care       Discharge Instructions    Call MD for:  difficulty breathing, headache or visual disturbances   Complete by: As directed    Call MD for:  persistant nausea and vomiting   Complete by: As directed    Call MD for:  redness, tenderness, or signs of infection (pain, swelling, redness, odor or green/yellow discharge around incision site)   Complete by: As directed    Call MD for:  temperature >100.4   Complete by: As directed    Diet - low sodium heart healthy   Complete by: As directed    Diet general   Complete by: As directed    Soft diet for 14 days   Increase activity slowly   Complete by: As directed      Allergies as of 03/16/2019      Reactions   Bee Pollen    Other reaction(s): Other (See Comments) REDNESS OF EYES, STUFFY NOSE   Pollen Extract Other (See Comments)   REDNESS OF EYES, STUFFY NOSE      Medication List    TAKE these medications   acetaminophen 325 MG tablet Commonly known as: TYLENOL Take 2 tablets (650 mg total) by mouth every 6 (six) hours. What changed:   when to take this  reasons to take this   albuterol 108  (90 Base) MCG/ACT inhaler Commonly known as: VENTOLIN HFA Inhale 1-2 puffs into the lungs every 6 (six) hours as needed for wheezing or shortness of breath.   ibuprofen 200 MG tablet Commonly known as: ADVIL Take 3 tablets (600 mg total) by mouth every 6 (six) hours. What changed:   when to take this  reasons to take this   loratadine 10 MG tablet Commonly known as: CLARITIN Take 10 mg by mouth daily as needed for allergies.   Norgestimate-Ethinyl Estradiol Triphasic 0.18/0.215/0.25 MG-25 MCG tab Take 1 tablet by mouth daily.   oxyCODONE 5 MG/5ML solution Commonly known as: ROXICODONE Take 5 mLs (5 mg total) by mouth every 4 (four) hours as needed for severe pain.      Follow-up Information    Helayne Seminole, MD. Schedule an appointment as soon as possible for a visit in 4 weeks.   Specialty: Otolaryngology Contact information: [place] [address] [phone]           Signed: Izora Gala 03/16/2019, 7:52 AM

## 2019-03-21 ENCOUNTER — Encounter (HOSPITAL_COMMUNITY): Payer: Self-pay | Admitting: Emergency Medicine

## 2019-03-21 ENCOUNTER — Other Ambulatory Visit: Payer: Self-pay

## 2019-03-21 ENCOUNTER — Emergency Department (HOSPITAL_COMMUNITY)
Admission: EM | Admit: 2019-03-21 | Discharge: 2019-03-22 | Disposition: A | Discharge status: Home / Self Care | Payer: Medicaid Other | Attending: Emergency Medicine | Admitting: Emergency Medicine

## 2019-03-21 DIAGNOSIS — E89810 Postprocedural hemorrhage and hematoma of an endocrine system organ or structure following an endocrine system procedure: Secondary | ICD-10-CM | POA: Diagnosis not present

## 2019-03-21 DIAGNOSIS — J95830 Postprocedural hemorrhage and hematoma of a respiratory system organ or structure following a respiratory system procedure: Secondary | ICD-10-CM

## 2019-03-21 DIAGNOSIS — G8918 Other acute postprocedural pain: Secondary | ICD-10-CM | POA: Diagnosis present

## 2019-03-21 DIAGNOSIS — F17200 Nicotine dependence, unspecified, uncomplicated: Secondary | ICD-10-CM | POA: Insufficient documentation

## 2019-03-21 DIAGNOSIS — J45909 Unspecified asthma, uncomplicated: Secondary | ICD-10-CM | POA: Diagnosis not present

## 2019-03-21 DIAGNOSIS — Z79899 Other long term (current) drug therapy: Secondary | ICD-10-CM | POA: Insufficient documentation

## 2019-03-21 NOTE — ED Notes (Signed)
Pt approached the desk spitting blood into an emesis bag.  Jessica-triage nurse was notified and stated that she would check on her. Pt and sort tech were informed.

## 2019-03-21 NOTE — ED Triage Notes (Signed)
Pt reports she had a tonsillectomy on Friday. Pt reports coughing up blood. Pt afebrile. Pt reports painful swallowing, voice is hoarse.

## 2019-03-22 LAB — CBC WITH DIFFERENTIAL/PLATELET
Abs Immature Granulocytes: 0.02 10*3/uL (ref 0.00–0.07)
Basophils Absolute: 0 10*3/uL (ref 0.0–0.1)
Basophils Relative: 0 %
Eosinophils Absolute: 0.1 10*3/uL (ref 0.0–0.5)
Eosinophils Relative: 1 %
HCT: 43.6 % (ref 36.0–46.0)
Hemoglobin: 14.5 g/dL (ref 12.0–15.0)
Immature Granulocytes: 0 %
Lymphocytes Relative: 20 %
Lymphs Abs: 1.5 10*3/uL (ref 0.7–4.0)
MCH: 29.4 pg (ref 26.0–34.0)
MCHC: 33.3 g/dL (ref 30.0–36.0)
MCV: 88.3 fL (ref 80.0–100.0)
Monocytes Absolute: 0.7 10*3/uL (ref 0.1–1.0)
Monocytes Relative: 9 %
Neutro Abs: 5.3 10*3/uL (ref 1.7–7.7)
Neutrophils Relative %: 70 %
Platelets: 330 10*3/uL (ref 150–400)
RBC: 4.94 MIL/uL (ref 3.87–5.11)
RDW: 11.9 % (ref 11.5–15.5)
WBC: 7.7 10*3/uL (ref 4.0–10.5)
nRBC: 0 % (ref 0.0–0.2)

## 2019-03-22 LAB — PROTIME-INR
INR: 1.2 (ref 0.8–1.2)
Prothrombin Time: 14.6 seconds (ref 11.4–15.2)

## 2019-03-22 LAB — BASIC METABOLIC PANEL
Anion gap: 12 (ref 5–15)
BUN: 11 mg/dL (ref 6–20)
CO2: 22 mmol/L (ref 22–32)
Calcium: 9.6 mg/dL (ref 8.9–10.3)
Chloride: 105 mmol/L (ref 98–111)
Creatinine, Ser: 0.85 mg/dL (ref 0.44–1.00)
GFR calc Af Amer: >60 mL/min (ref >60)
GFR calc non Af Amer: >60 mL/min (ref >60)
Glucose, Bld: 97 mg/dL (ref 70–99)
Potassium: 3.8 mmol/L (ref 3.5–5.1)
Sodium: 139 mmol/L (ref 135–145)

## 2019-03-22 MED ORDER — FENTANYL CITRATE (PF) 100 MCG/2ML IJ SOLN
50.0000 ug | Freq: Once | INTRAMUSCULAR | Status: AC
  Administered 2019-03-22: 50 ug via INTRAVENOUS
  Filled 2019-03-22: qty 2

## 2019-03-22 MED ORDER — ONDANSETRON HCL 4 MG/2ML IJ SOLN
4.0000 mg | Freq: Once | INTRAMUSCULAR | Status: AC
  Administered 2019-03-22: 4 mg via INTRAVENOUS
  Filled 2019-03-22: qty 2

## 2019-03-22 MED ORDER — TRANEXAMIC ACID 1000 MG/10ML IV SOLN
500.0000 mg | Freq: Once | INTRAVENOUS | Status: AC
  Administered 2019-03-22: 01:00:00 500 mg via TOPICAL
  Filled 2019-03-22: qty 10

## 2019-03-22 NOTE — ED Notes (Signed)
ED Provider at bedside. 

## 2019-03-22 NOTE — ED Notes (Signed)
Pt reports she has not eaten anything, just jello.  Bleeding started around 8pm.

## 2019-03-22 NOTE — ED Provider Notes (Signed)
TIME SEEN: 12:28 AM  CHIEF COMPLAINT: Postoperative bleeding  HPI: Patient is a 18 year old female with history of obesity, asthma who presents to the emergency department with postoperative bleeding.  She underwent tonsillectomy 1 week ago with Dr. Blenda Nicely on 03/15/19.  States she started bleeding around 8 PM tonight.  She called the on-call physician who instructed her to come to the emergency department.  She is having a hard time opening her mouth and swallowing secondary to pain.  No fever.  She is not on antiplatelets or anticoagulants.  ROS: See HPI Constitutional: no fever  Eyes: no drainage  ENT: no runny nose   Cardiovascular:  no chest pain  Resp: no SOB  GI: no vomiting GU: no dysuria Integumentary: no rash  Allergy: no hives  Musculoskeletal: no leg swelling  Neurological: no slurred speech ROS otherwise negative  PAST MEDICAL HISTORY/PAST SURGICAL HISTORY:  Past Medical History:  Diagnosis Date  . Asthma    prn inhaler  . Complication of anesthesia 04/27/2015   vomiting and laryngospasm on extubation, requiring CPAP, jaw thrust and succinylcholine  . 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 Irregular periods    Hx - now on OCP daily  . Obesity   . Pollen allergy   . PONV (postoperative nausea and vomiting)     MEDICATIONS:  Prior to Admission medications   Medication Sig Start Date End Date Taking? Authorizing Provider  acetaminophen (TYLENOL) 325 MG tablet Take 2 tablets (650 mg total) by mouth every 6 (six) hours. 03/15/19   Marcellino, San Jetty, MD  albuterol (PROVENTIL HFA;VENTOLIN HFA) 108 (90 Base) MCG/ACT inhaler Inhale 1-2 puffs into the lungs every 6 (six) hours as needed for wheezing or shortness of breath. 12/03/18   Rancour, Annie Main, MD  ibuprofen (ADVIL) 200 MG tablet Take 3 tablets (600 mg total) by mouth every 6 (six) hours. 03/15/19   Marcellino, San Jetty, MD  loratadine (CLARITIN) 10 MG tablet Take 10 mg by mouth daily as needed for allergies.   05/23/18   [provider]  Norgestimate-Ethinyl Estradiol Triphasic 0.18/0.215/0.25 MG-25 MCG tab Take 1 tablet by mouth daily.  03/20/18   [provider]  oxyCODONE (ROXICODONE) 5 MG/5ML solution Take 5 mLs (5 mg total) by mouth every 4 (four) hours as needed for severe pain. 03/15/19   Helayne Seminole, MD    ALLERGIES:  Allergies  Allergen Reactions  . Bee Pollen     Other reaction(s): Other (See Comments) REDNESS OF EYES, STUFFY NOSE  . Pollen Extract Other (See Comments)    REDNESS OF EYES, STUFFY NOSE    SOCIAL HISTORY:  Social History   Tobacco Use  . Smoking status: Current Some Day Smoker    Types: Cigars  . Smokeless tobacco: Never Used  . Tobacco comment: Black and Mild Cigars, 2 cigars/week  Substance Use Topics  . Alcohol use: No    FAMILY HISTORY: Family History  Problem Relation Age of Onset  . Hypertension Maternal Grandmother   . Kidney cancer Maternal Grandmother   . Hypertension Mother   . Heart disease Mother        MI  . Pulmonary embolism Mother   . Thyroid disease Mother   . Hernia Mother   . Diabetes Maternal Uncle   . Diabetes Maternal Grandfather   . Hypertension Maternal Grandfather   . Cirrhosis Maternal Aunt     EXAM: BP 132/89   Pulse (!) 104   Temp 98.7 F (37.1 C) (Oral)  Resp 16   LMP  (LMP Unknown)   SpO2 99%  CONSTITUTIONAL: Alert and oriented and responds appropriately to questions.  Morbidly obese.  Appears uncomfortable.  Actively spitting out blood. HEAD: Normocephalic EYES: Conjunctivae clear, pupils appear equal, EOMI ENT: normal nose; moist mucous membranes; patient has a large amount of clot and active oozing around the right posterior oropharynx.  She has trismus on exam and muffled phonation.  No stridor, drooling. NECK: Supple, no meningismus, no nuchal rigidity, no LAD  CARD: RRR; S1 and S2 appreciated; no murmurs, no clicks, no rubs, no gallops RESP: Normal chest excursion without splinting  or tachypnea; breath sounds clear and equal bilaterally; no wheezes, no rhonchi, no rales, no hypoxia or respiratory distress, speaking full sentences ABD/GI: Normal bowel sounds; non-distended; soft, non-tender, no rebound, no guarding, no peritoneal signs, no hepatosplenomegaly BACK:  The back appears normal and is non-tender to palpation, there is no CVA tenderness EXT: Normal ROM in all joints; non-tender to palpation; no edema; normal capillary refill; no cyanosis, no calf tenderness or swelling    SKIN: Normal color for age and race; warm; no rash NEURO: Moves all extremities equally PSYCH: The patient's mood and manner are appropriate. Grooming and personal hygiene are appropriate.  MEDICAL DECISION MAKING: Patient here with postoperative bleeding after tonsillectomy 1 week ago.  States she was doing well until today.  Pain poorly controlled currently.  Will give fentanyl, Zofran.  Will also give TXA neb for bleeding.  Will check labs.  ED PROGRESS: Patient reports feeling better after pain medication and her trismus has improved.  She has no active bleeding from her posterior oropharynx but still has a large clot.  We will continue to monitor closely for signs of active bleeding.  Her labs here are reassuring including normal hemoglobin, no thrombocytopenia or coagulopathy.  Patient has been observed for over an hour after her TXA neb and she is no longer spitting up blood and there is no active bleeding.  She is asking for more pain medications and states that she is "running low".  Have advised her to follow-up with her ENT surgeon for further pain control.  Discussed bleeding return precautions.   At this time, I do not feel there is any life-threatening condition present. I have reviewed and discussed all results (EKG, imaging, lab, urine as appropriate) and exam findings with patient/family. I have reviewed nursing notes and appropriate previous records.  I feel the patient is safe to be  discharged home without further emergent workup and can continue workup as an outpatient as needed. Discussed usual and customary return precautions. Patient/family verbalize understanding and are comfortable with this plan.  Outpatient follow-up has been provided as needed. All questions have been answered.    Aayush Gelpi, Layla MawKristen N, DO 03/22/19 [phone]

## 2019-03-22 NOTE — Discharge Instructions (Addendum)
You will need to follow-up with your ENT surgeon for further pain management.

## 2019-03-22 NOTE — ED Notes (Signed)
Pt given water to drink, able to keep it down but states it hurts to swallow. No coughing up blood at this point.

## 2019-12-19 ENCOUNTER — Ambulatory Visit: Payer: No Typology Code available for payment source | Admitting: Podiatry

## 2020-03-29 ENCOUNTER — Other Ambulatory Visit: Payer: Self-pay

## 2020-03-29 ENCOUNTER — Emergency Department (HOSPITAL_COMMUNITY)
Admission: EM | Admit: 2020-03-29 | Discharge: 2020-03-29 | Disposition: A | Discharge status: Home / Self Care | Payer: Medicaid Other | Attending: Emergency Medicine | Admitting: Emergency Medicine

## 2020-03-29 DIAGNOSIS — E669 Obesity, unspecified: Secondary | ICD-10-CM | POA: Insufficient documentation

## 2020-03-29 DIAGNOSIS — F17290 Nicotine dependence, other tobacco product, uncomplicated: Secondary | ICD-10-CM | POA: Insufficient documentation

## 2020-03-29 DIAGNOSIS — R519 Headache, unspecified: Secondary | ICD-10-CM | POA: Diagnosis not present

## 2020-03-29 DIAGNOSIS — J45909 Unspecified asthma, uncomplicated: Secondary | ICD-10-CM | POA: Insufficient documentation

## 2020-03-29 DIAGNOSIS — Z79899 Other long term (current) drug therapy: Secondary | ICD-10-CM | POA: Diagnosis not present

## 2020-03-29 DIAGNOSIS — R599 Enlarged lymph nodes, unspecified: Secondary | ICD-10-CM | POA: Diagnosis not present

## 2020-03-29 NOTE — ED Provider Notes (Signed)
[facility] EMERGENCY DEPARTMENT Provider Note   CSN: [id number] Arrival date & time: 03/29/20  1131     History Chief Complaint  Patient presents with  . bump on the neck    Samantha Morse is a 19 y.o. female.  HPI 19 year old female with a history of asthma, hydradenitis axillaris, obesity presents to the ER with a swollen bump on the back of her neck which started yesterday.  Patient states that she noticed a bump in the back of her neck which became progressively tender throughout the day.  She has not noticed a growing.  She denies any fevers, chills, swelling.  She denies any cough, sore throat.  She states that this morning she woke up with a sore body, but she is unclear if this is due to sleeping poorly in her bed.  She denies any stiffness in her neck.  She does have a history of hydradenitis however frequently gets them in the axilla, not on the neck.  She states that she also has been having some headaches, which normally gets relieved with Goody powder.  She denies any vision changes, nausea or vomiting.  No abdominal pain.  No vision changes.  No unilateral weakness.    Past Medical History:  Diagnosis Date  . Asthma    prn inhaler  . Complication of anesthesia 04/27/2015   vomiting and laryngospasm on extubation, requiring CPAP, jaw thrust and succinylcholine  . 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 Irregular periods    Hx - now on OCP daily  . Obesity   . Pollen allergy   . PONV (postoperative nausea and vomiting)     Patient Active Problem List   Diagnosis Date Noted  . 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 Peritonsillar abscess 02/18/2019  . Obesity 06/28/2016  . Hidradenitis suppurativa 06/28/2016  . Amenorrhea 06/28/2016    Past Surgical History:  Procedure Laterality Date  . HYDRADENITIS EXCISION Left 04/27/2015   Procedure: EXCISION HIDRADENITIS LEFT AXILLA [name] CLOSURE ;  Surgeon: Louisa Second, MD;  Location: [facility];  Service: Plastics;  Laterality: Left;  . HYDRADENITIS EXCISION Right 08/10/2015   Procedure: EXCISION HIDRADENITIS RIGHT AXILLA, RYAN POLLOCK CLOSURE;  Surgeon: Louisa Second, MD;  Location: [facility];  Service: Plastics;  Laterality: Right;  . TONSILLECTOMY  03/15/2019  . TONSILLECTOMY Bilateral 03/15/2019   Procedure: TONSILLECTOMY;  Surgeon: Graylin Shiver, MD;  Location: [facility];  Service: ENT;  Laterality: Bilateral;     OB History    Gravida  0   Para  0   Term  0   Preterm  0   AB  0   Living  0     SAB  0   TAB  0   Ectopic  0   Multiple  0   Live Births  0           Family History  Problem Relation Age of Onset  . Hypertension Maternal Grandmother   . Kidney cancer Maternal Grandmother   . Hypertension Mother   . Heart disease Mother        MI  . Pulmonary embolism Mother   . Thyroid disease Mother   . Hernia Mother   . Diabetes Maternal Uncle   . Diabetes Maternal Grandfather   . Hypertension Maternal Grandfather   . Cirrhosis Maternal Aunt     Social History   Tobacco Use  . Smoking status: Current Some Day  Smoker    Types: Cigars  . Smokeless tobacco: Never Used  . Tobacco comment: Black and Mild Cigars, 2 cigars/week  Vaping Use  . Vaping Use: Never used  Substance Use Topics  . Alcohol use: No  . Drug use: No    Home Medications Prior to Admission medications   Medication Sig Start Date End Date Taking? Authorizing Provider  acetaminophen (TYLENOL) 325 MG tablet Take 2 tablets (650 mg total) by mouth every 6 (six) hours. 03/15/19   Marcellino, Glee Arvin, MD  albuterol (PROVENTIL HFA;VENTOLIN HFA) 108 (90 Base) MCG/ACT inhaler Inhale 1-2 puffs into the lungs every 6 (six) hours as needed for wheezing or shortness of breath. 12/03/18   Rancour, Jeannett Senior, MD  ibuprofen (ADVIL) 200 MG tablet Take 3 tablets (600 mg total) by mouth every 6 (six) hours. 03/15/19   Marcellino, Glee Arvin, MD  loratadine  (CLARITIN) 10 MG tablet Take 10 mg by mouth daily as needed for allergies.  05/23/18   [provider]  Norgestimate-Ethinyl Estradiol Triphasic 0.18/0.215/0.25 MG-25 MCG tab Take 1 tablet by mouth daily.  03/20/18   [provider]  oxyCODONE (ROXICODONE) 5 MG/5ML solution Take 5 mLs (5 mg total) by mouth every 4 (four) hours as needed for severe pain. 03/15/19   Graylin Shiver, MD    Allergies    Bee pollen and Pollen extract  Review of Systems   Review of Systems  Constitutional: Negative for chills and fever.  HENT: Negative for congestion, ear pain, sinus pain, sore throat and trouble swallowing.   Respiratory: Negative for cough and shortness of breath.   Cardiovascular: Negative for chest pain.  Gastrointestinal: Negative for abdominal pain, nausea and vomiting.  Skin:       Bump on the back of the neck, painful  Neurological: Positive for headaches. Negative for dizziness, weakness and numbness.    Physical Exam Updated Vital Signs BP (!) 148/98 (BP Location: Right Arm)   Pulse 76   Temp 98.2 F (36.8 C) (Oral)   Resp 17   SpO2 100%   Physical Exam Vitals and nursing note reviewed.  Constitutional:      General: She is not in acute distress.    Appearance: Normal appearance. She is well-developed. She is obese.  HENT:     Head: Normocephalic and atraumatic.     Mouth/Throat:     Mouth: Mucous membranes are moist.     Pharynx: Oropharynx is clear. No oropharyngeal exudate or posterior oropharyngeal erythema.     Comments: Uvula midline, throat nonerythematous.  Patient had a tonsillectomy.  Normal tongue size.  Tolerating secretions well.  No sublingual/submandibular swelling. Eyes:     Conjunctiva/sclera: Conjunctivae normal.  Neck:     Comments: Right posterior neck with 1 mm slightly tender oval raised bump.  There is no surrounding erythema, redness, swelling.  No fluctuance, or surrounding induration.  Patient has full range of motion and  strength of neck. Negative brudzinski's.  Noted acanthosis nigricans.  Possible cervical lymphadenopathy.  No supraclavicular, sublingual, submandibular, axillary, anterior cervical chain lymphadenopathy noted.  Patient does have a large neck and body habitus, difficult to palpate.  Trachea midline. Cardiovascular:     Rate and Rhythm: Normal rate and regular rhythm.     Pulses: Normal pulses.     Heart sounds: Normal heart sounds. No murmur heard.   Pulmonary:     Effort: Pulmonary effort is normal. No respiratory distress.     Breath sounds: Normal breath sounds.  Abdominal:     General: Abdomen is flat.     Palpations: Abdomen is soft.     Tenderness: There is no abdominal tenderness.  Genitourinary:    General: Normal vulva.     Rectum: Normal.  Musculoskeletal:        General: No tenderness. Normal range of motion.     Cervical back: Neck supple.  Skin:    General: Skin is warm and dry.     Findings: No erythema or rash.  Neurological:     General: No focal deficit present.     Mental Status: She is alert and oriented to person, place, and time.     Sensory: No sensory deficit.     Motor: No weakness.     Comments: Moving all 4 extremities without difficulty.  Speech oriented and goal-directed.  Psychiatric:        Mood and Affect: Mood normal.        Behavior: Behavior normal.     ED Results / Procedures / Treatments   Labs (all labs ordered are listed, but only abnormal results are displayed) Labs Reviewed - No data to display  EKG None  Radiology No results found.  Procedures Procedures (including critical care time)  Medications Ordered in ED Medications - No data to display  ED Course  I have reviewed the triage vital signs and the nursing notes.  Pertinent labs & imaging results that were available during my care of the patient were reviewed by me and considered in my medical decision making (see chart for details).    MDM Rules/Calculators/A&P                          Patient with 1 mm swollen lymph node to her posterior neck.  There is no evidence of an abscess, no erythema, fluctuance, swelling.  Patient has no evidence of peritonsillar abscess, doubt meningitis as patient is full range of motion, and has no fevers or chills.  Overall well-appearing.  She has no subclavicular, no parotid swelling, no drooling.  Likely posterior cervical lymphadenopathy.  Patient educated on her symptoms, encouraged her to follow-up with her PCP if this does not improve.  She voices understanding is agreeable.  All of her questions have been answered to her satisfaction, she voices understanding and is agreeable to this plan.   Final Clinical Impression(s) / ED Diagnoses Final diagnoses:  Swollen lymph nodes    Rx / DC Orders ED Discharge Orders    None       Leone Brand 03/29/20 1633    Sabino Donovan, MD 03/29/20 2112

## 2020-03-29 NOTE — Discharge Instructions (Signed)
The swollen bump in the back your neck is slightly likely a swollen lymph node.  I do not think that this is an abscess.  You may try to put some warm compresses and take Tylenol or ibuprofen for pain.  These usually resolve on their own.  If your symptoms not improve, please follow-up with your primary care doctor.  Return to the ER if your symptoms worsen.

## 2020-03-29 NOTE — ED Triage Notes (Signed)
Pt c/o some bumps on the back of her neck since last night getting painful today;.

## 2020-04-24 ENCOUNTER — Encounter (HOSPITAL_COMMUNITY): Payer: Self-pay

## 2020-04-24 ENCOUNTER — Ambulatory Visit (HOSPITAL_COMMUNITY)
Admission: EM | Admit: 2020-04-24 | Discharge: 2020-04-24 | Disposition: A | Discharge status: Home / Self Care | Payer: Medicaid Other | Attending: Family Medicine | Admitting: Family Medicine

## 2020-04-24 ENCOUNTER — Other Ambulatory Visit: Payer: Self-pay

## 2020-04-24 DIAGNOSIS — R519 Headache, unspecified: Secondary | ICD-10-CM

## 2020-04-24 DIAGNOSIS — R0789 Other chest pain: Secondary | ICD-10-CM

## 2020-04-24 DIAGNOSIS — M791 Myalgia, unspecified site: Secondary | ICD-10-CM

## 2020-04-24 MED ORDER — IBUPROFEN 800 MG PO TABS: 800.0000 mg | ORAL_TABLET | Freq: Three times a day (TID) | ORAL | 0 refills | Status: DC | PRN

## 2020-04-24 MED ORDER — CYCLOBENZAPRINE HCL 10 MG PO TABS: 10.0000 mg | ORAL_TABLET | Freq: Two times a day (BID) | ORAL | 0 refills | Status: DC | PRN

## 2020-04-24 NOTE — ED Provider Notes (Signed)
[facility]   [id number] 04/24/20 Arrival Time: 6063  [id number] PAIN  SUBJECTIVE: History from: patient. Samantha Morse is a 19 y.o. female complains of posterior neck, right shoulder, right chest wall pain that began about 2 weeks ago.  Reports that she was seen in the hospital and states that she was told that she had some enlarged lymph nodes.  Has been taking ibuprofen and Tylenol with little relief.  Reports headaches as well. Symptoms are made worse with activity.  Denies similar symptoms in the past.  Declines Toradol and Decadron injections in office today.  Denies fever, chills, erythema, ecchymosis, effusion, weakness, numbness and tingling, saddle paresthesias, loss of bowel or bladder function.      ROS: As per HPI.  All other pertinent ROS negative.     Past Medical History:  Diagnosis Date  . Asthma    prn inhaler  . Complication of anesthesia 04/27/2015   vomiting and laryngospasm on extubation, requiring CPAP, jaw thrust and succinylcholine  . 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 Irregular periods    Hx - now on OCP daily  . Obesity   . Pollen allergy   . PONV (postoperative nausea and vomiting)    Past Surgical History:  Procedure Laterality Date  . HYDRADENITIS EXCISION Left 04/27/2015   Procedure: EXCISION HIDRADENITIS LEFT AXILLA [name] CLOSURE ;  Surgeon: Louisa Second, MD;  Location: [facility];  Service: Plastics;  Laterality: Left;  . HYDRADENITIS EXCISION Right 08/10/2015   Procedure: EXCISION HIDRADENITIS RIGHT AXILLA, RYAN POLLOCK CLOSURE;  Surgeon: Louisa Second, MD;  Location: [facility];  Service: Plastics;  Laterality: Right;  . TONSILLECTOMY  03/15/2019  . TONSILLECTOMY Bilateral 03/15/2019   Procedure: TONSILLECTOMY;  Surgeon: Graylin Shiver, MD;  Location: [facility];  Service: ENT;  Laterality: Bilateral;   Allergies  Allergen Reactions  . Bee Pollen     Other reaction(s): Other (See  Comments) REDNESS OF EYES, STUFFY NOSE  . Pollen Extract Other (See Comments)    REDNESS OF EYES, STUFFY NOSE   No current facility-administered medications on file prior to encounter.   Current Outpatient Medications on File Prior to Encounter  Medication Sig Dispense Refill  . acetaminophen (TYLENOL) 325 MG tablet Take 2 tablets (650 mg total) by mouth every 6 (six) hours.    Marland Kitchen albuterol (PROVENTIL HFA;VENTOLIN HFA) 108 (90 Base) MCG/ACT inhaler Inhale 1-2 puffs into the lungs every 6 (six) hours as needed for wheezing or shortness of breath. 1 Inhaler 0  . loratadine (CLARITIN) 10 MG tablet Take 10 mg by mouth daily as needed for allergies.   11  . Norgestimate-Ethinyl Estradiol Triphasic 0.18/0.215/0.25 MG-25 MCG tab Take 1 tablet by mouth daily.   6  . oxyCODONE (ROXICODONE) 5 MG/5ML solution Take 5 mLs (5 mg total) by mouth every 4 (four) hours as needed for severe pain. 215 mL 0   Social History   Socioeconomic History  . Marital status: Single    Spouse name: Not on file  . Number of children: Not on file  . Years of education: Not on file  . Highest education level: Not on file  Occupational History  . Not on file  Tobacco Use  . Smoking status: Current Some Day Smoker    Types: Cigars  . Smokeless tobacco: Never Used  . Tobacco comment: Black and Mild Cigars, 2 cigars/week  Vaping Use  . Vaping Use: Never used  Substance and Sexual Activity  .  Alcohol use: No  . Drug use: No  . Sexual activity: Yes    Birth control/protection: Pill  Other Topics Concern  . Not on file  Social History Narrative  . Not on file   Social Determinants of Health   Financial Resource Strain:   . Difficulty of Paying Living Expenses: Not on file  Food Insecurity:   . Worried About Programme researcher, broadcasting/film/video in the Last Year: Not on file  . Ran Out of Food in the Last Year: Not on file  Transportation Needs:   . Lack of Transportation (Medical): Not on file  . Lack of Transportation  (Non-Medical): Not on file  Physical Activity:   . Days of Exercise per Week: Not on file  . Minutes of Exercise per Session: Not on file  Stress:   . Feeling of Stress : Not on file  Social Connections:   . Frequency of Communication with Friends and Family: Not on file  . Frequency of Social Gatherings with Friends and Family: Not on file  . Attends Religious Services: Not on file  . Active Member of Clubs or Organizations: Not on file  . Attends Banker Meetings: Not on file  . Marital Status: Not on file  Intimate Partner Violence:   . Fear of Current or Ex-Partner: Not on file  . Emotionally Abused: Not on file  . Physically Abused: Not on file  . Sexually Abused: Not on file   Family History  Problem Relation Age of Onset  . Hypertension Maternal Grandmother   . Kidney cancer Maternal Grandmother   . Hypertension Mother   . Heart disease Mother        MI  . Pulmonary embolism Mother   . Thyroid disease Mother   . Hernia Mother   . Diabetes Maternal Uncle   . Diabetes Maternal Grandfather   . Hypertension Maternal Grandfather   . Cirrhosis Maternal Aunt     OBJECTIVE:  Vitals:   04/24/20 1022 04/24/20 1027  BP: (!) 152/78   Pulse: 81   Resp: 18   Temp: 98.2 F (36.8 C)   TempSrc: Oral   SpO2: 100%   Weight:  (!) 330 lb (149.7 kg)  Height:  5\' 4"  (1.626 m)    General appearance: ALERT; in no acute distress.  Head: NCAT Lungs: Normal respiratory effort CV:  pulses 2+ bilaterally. Cap refill < 2 seconds Musculoskeletal:  Inspection: Skin warm, dry, clear and intact without obvious erythema, effusion, or ecchymosis.  Palpation: Right posterior neck, right chest wall tenderness with palpation  ROM: FROM active and passive Skin: warm and dry Neurologic: Ambulates without difficulty; Sensation intact about the upper/ lower extremities Psychological: alert and cooperative; normal mood and affect  DIAGNOSTIC STUDIES:  No results found.    ASSESSMENT & PLAN:  1. Muscle pain   2. Nonintractable headache, unspecified chronicity pattern, unspecified headache type   3. Chest wall pain     Meds ordered this encounter  Medications  . ibuprofen (ADVIL) 800 MG tablet    Sig: Take 1 tablet (800 mg total) by mouth every 8 (eight) hours as needed for moderate pain.    Dispense:  21 tablet    Refill:  0    Order Specific Question:   Supervising Provider    Answer:   Merrilee Jansky  . cyclobenzaprine (FLEXERIL) 10 MG tablet    Sig: Take 1 tablet (10 mg total) by mouth 2 (two) times daily as needed  for muscle spasms.    Dispense:  20 tablet    Refill:  0    Order Specific Question:   Supervising Provider    Answer:   Merrilee Jansky [id number]    Continue conservative management of rest, ice, and gentle stretches Take ibuprofen as needed for pain relief (may cause abdominal discomfort, ulcers, and GI bleeds avoid taking with other NSAIDs) Take cyclobenzaprine at nighttime for symptomatic relief. Avoid driving or operating heavy machinery while using medication. Follow up with PCP if symptoms persist Return or go to the ER if you have any new or worsening symptoms (fever, chills, chest pain, abdominal pain, changes in bowel or bladder habits, pain radiating into lower legs)   Reviewed expectations re: course of current medical issues. Questions answered. Outlined signs and symptoms indicating need for more acute intervention. Patient verbalized understanding. After Visit Summary given.       Moshe Cipro, NP 04/24/20 1109

## 2020-04-24 NOTE — Discharge Instructions (Addendum)
Take ibuprofen as needed for your pain.    Take the muscle relaxer Flexeril as needed for muscle spasm; Do not drive, operate machinery, or drink alcohol with this medication as it may make you drowsy.    Follow up with your primary care provider or an orthopedist if your pain is not improving.     

## 2020-04-24 NOTE — ED Triage Notes (Signed)
Pt c/o 8/10 non radiating sharp pain across chest and chest tightness. Pt states she is unable to lift right shoulder up due to the pain. PT c/o 6/10 sharp pain in on posterior left side of neck. PT states she has 2 lumps on area of neck that is painful. Pt denies N/V, SOB.

## 2020-04-27 ENCOUNTER — Other Ambulatory Visit: Payer: Self-pay

## 2020-04-27 ENCOUNTER — Encounter (HOSPITAL_COMMUNITY): Payer: Self-pay | Admitting: Emergency Medicine

## 2020-04-27 ENCOUNTER — Ambulatory Visit (HOSPITAL_COMMUNITY)
Admission: EM | Admit: 2020-04-27 | Discharge: 2020-04-27 | Disposition: A | Discharge status: Home / Self Care | Payer: Medicaid Other | Attending: Family Medicine | Admitting: Family Medicine

## 2020-04-27 DIAGNOSIS — F17290 Nicotine dependence, other tobacco product, uncomplicated: Secondary | ICD-10-CM | POA: Insufficient documentation

## 2020-04-27 DIAGNOSIS — R234 Changes in skin texture: Secondary | ICD-10-CM | POA: Diagnosis not present

## 2020-04-27 DIAGNOSIS — B349 Viral infection, unspecified: Secondary | ICD-10-CM | POA: Diagnosis not present

## 2020-04-27 DIAGNOSIS — G44209 Tension-type headache, unspecified, not intractable: Secondary | ICD-10-CM | POA: Diagnosis not present

## 2020-04-27 DIAGNOSIS — R52 Pain, unspecified: Secondary | ICD-10-CM | POA: Diagnosis not present

## 2020-04-27 DIAGNOSIS — R519 Headache, unspecified: Secondary | ICD-10-CM | POA: Diagnosis present

## 2020-04-27 DIAGNOSIS — Z79899 Other long term (current) drug therapy: Secondary | ICD-10-CM | POA: Insufficient documentation

## 2020-04-27 DIAGNOSIS — Z793 Long term (current) use of hormonal contraceptives: Secondary | ICD-10-CM | POA: Insufficient documentation

## 2020-04-27 DIAGNOSIS — E669 Obesity, unspecified: Secondary | ICD-10-CM | POA: Diagnosis not present

## 2020-04-27 DIAGNOSIS — Z20822 Contact with and (suspected) exposure to covid-19: Secondary | ICD-10-CM | POA: Insufficient documentation

## 2020-04-27 DIAGNOSIS — R197 Diarrhea, unspecified: Secondary | ICD-10-CM | POA: Insufficient documentation

## 2020-04-27 NOTE — ED Provider Notes (Signed)
[facility]    CSN: [id number] Arrival date & time: 04/27/20  1257      History   Chief Complaint Chief Complaint  Patient presents with  . Abscess  . Generalized Body Aches    HPI Samantha Morse is a 19 y.o. female with a history of hidradenitis suppurativa and elevated BMI (56) presenting for evaluation of muscle aches, bump in her axillae, and a headache.  She was recently seen in urgent care on 8/27 for muscle aches in her posterior neck, shoulders, and chest wall for the past 1-2 weeks.  Has not noticed much difference with Flexeril.  Has some improvement with Tylenol/ibuprofen.  Stands up all day for work at [employer] and feels like her neck/upper back are tight.  In addition to this, she now has developed a throbbing headache since yesterday and watery brown diarrhea since this morning.  Bandlike across her forehead, worse on the right side though.  Ibuprofen helps.  She has been drinking plenty of fluids.  Does not sleep much because she works third shift.  Does not drink soda or coffee often.  Denies any associated visual changes, fever, rash, N/V,  numbness/tingling, or weakness.  She noticed a painless bump in her right axilla a few days ago and wondered if this was contributing.  Denies any pain in the area, no overlying skin erythema or swelling.  Not vaccinated against Covid.  Past Medical History:  Diagnosis Date  . Asthma    prn inhaler  . Complication of anesthesia 04/27/2015   vomiting and laryngospasm on extubation, requiring CPAP, jaw thrust and succinylcholine  . 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 Irregular periods    Hx - now on OCP daily  . Obesity   . Pollen allergy   . PONV (postoperative nausea and vomiting)     Patient Active Problem List   Diagnosis Date Noted  . 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 Peritonsillar abscess 02/18/2019  . Obesity 06/28/2016  . Hidradenitis suppurativa 06/28/2016  . Amenorrhea 06/28/2016    Past  Surgical History:  Procedure Laterality Date  . HYDRADENITIS EXCISION Left 04/27/2015   Procedure: EXCISION HIDRADENITIS LEFT AXILLA [name] CLOSURE ;  Surgeon: Louisa Second, MD;  Location: [facility];  Service: Plastics;  Laterality: Left;  . HYDRADENITIS EXCISION Right 08/10/2015   Procedure: EXCISION HIDRADENITIS RIGHT AXILLA, RYAN POLLOCK CLOSURE;  Surgeon: Louisa Second, MD;  Location: [facility];  Service: Plastics;  Laterality: Right;  . TONSILLECTOMY  03/15/2019  . TONSILLECTOMY Bilateral 03/15/2019   Procedure: TONSILLECTOMY;  Surgeon: Graylin Shiver, MD;  Location: [facility];  Service: ENT;  Laterality: Bilateral;    OB History    Gravida  0   Para  0   Term  0   Preterm  0   AB  0   Living  0     SAB  0   TAB  0   Ectopic  0   Multiple  0   Live Births  0            Home Medications    Prior to Admission medications   Medication Sig Start Date End Date Taking? Authorizing Provider  cyclobenzaprine (FLEXERIL) 10 MG tablet Take 1 tablet (10 mg total) by mouth 2 (two) times daily as needed for muscle spasms. 04/24/20  Yes Moshe Cipro, NP  ibuprofen (ADVIL) 800 MG tablet Take 1 tablet (800 mg total) by mouth every 8 (eight)  hours as needed for moderate pain. 04/24/20  Yes Moshe Cipro, NP  loratadine (CLARITIN) 10 MG tablet Take 10 mg by mouth daily as needed for allergies.  05/23/18  Yes [provider]  Norgestimate-Ethinyl Estradiol Triphasic 0.18/0.215/0.25 MG-25 MCG tab Take 1 tablet by mouth daily.  03/20/18  Yes [provider]  acetaminophen (TYLENOL) 325 MG tablet Take 2 tablets (650 mg total) by mouth every 6 (six) hours. 03/15/19   Marcellino, Glee Arvin, MD  albuterol (PROVENTIL HFA;VENTOLIN HFA) 108 (90 Base) MCG/ACT inhaler Inhale 1-2 puffs into the lungs every 6 (six) hours as needed for wheezing or shortness of breath. 12/03/18   Glynn Octave, MD    Family History Family  History  Problem Relation Age of Onset  . Hypertension Maternal Grandmother   . Kidney cancer Maternal Grandmother   . Hypertension Mother   . Heart disease Mother        MI  . Pulmonary embolism Mother   . Thyroid disease Mother   . Hernia Mother   . Diabetes Maternal Uncle   . Diabetes Maternal Grandfather   . Hypertension Maternal Grandfather   . Cirrhosis Maternal Aunt     Social History Social History   Tobacco Use  . Smoking status: Current Some Day Smoker    Types: Cigars  . Smokeless tobacco: Never Used  . Tobacco comment: Black and Mild Cigars, 2 cigars/week  Vaping Use  . Vaping Use: Never used  Substance Use Topics  . Alcohol use: No  . Drug use: No     Allergies   Bee pollen and Pollen extract   Review of Systems Review of Systems  Constitutional: Negative for chills, fatigue and fever.  HENT: Negative for congestion, postnasal drip, rhinorrhea and sore throat.   Respiratory: Negative for cough.   Cardiovascular: Negative for chest pain.  Gastrointestinal: Positive for diarrhea. Negative for abdominal distention, abdominal pain, blood in stool, constipation, nausea and vomiting.  Endocrine: Negative for polydipsia and polyuria.  Genitourinary: Negative for dysuria.  Musculoskeletal: Positive for myalgias and neck pain. Negative for neck stiffness.  Neurological: Positive for headaches. Negative for dizziness, syncope, weakness, light-headedness and numbness.   Physical Exam Triage Vital Signs ED Triage Vitals  Enc Vitals Group     BP 04/27/20 1549 (!) 165/96     Pulse Rate 04/27/20 1549 81     Resp 04/27/20 1549 (!) 22     Temp 04/27/20 1549 98.4 F (36.9 C)     Temp Source 04/27/20 1549 Oral     SpO2 04/27/20 1549 100 %     Weight --      Height --      Head Circumference --      Peak Flow --      Pain Score 04/27/20 1544 10     Pain Loc --      Pain Edu? --      Excl. in GC? --    No data found.  Updated Vital Signs BP (!) 144/67  (BP Location: Right Arm) Comment (BP Location): repositioning  Pulse 79   Temp 98.4 F (36.9 C) (Oral)   Resp (!) 22   SpO2 99%   Physical Exam Constitutional:      General: She is not in acute distress.    Appearance: Normal appearance. She is not ill-appearing.  HENT:     Head: Normocephalic and atraumatic.     Mouth/Throat:     Mouth: Mucous membranes are moist.  Neck:  Comments: Acanthosis nigricans present.  Full ROM of cervical spine, generalized tenderness around posterior neck/upper back with tense bilateral trapezius musculature.  Cardiovascular:     Pulses: Normal pulses.  Pulmonary:     Effort: Pulmonary effort is normal.  Musculoskeletal:     Cervical back: Normal range of motion and neck supple. No rigidity.  Lymphadenopathy:     Cervical: No cervical adenopathy.  Skin:    General: Skin is warm and dry.     Capillary Refill: Capillary refill takes less than 2 seconds.     Findings: No bruising or rash.     Comments: Thickened skin within axilla present bilaterally with intermittently nodular feel to palpation.  No overt mass or abscess palpated within bilateral axilla, no skin erythema, warmth to touch, or swelling present.  Neurological:     Mental Status: She is alert.     Comments: Alert and oriented.  Able to follow simple commands.  EOMI, PERRLA.  CN II-XII intact.  5/5 upper and lower extremity strength bilaterally.  Normal gait.    UC Treatments / Results  Labs (all labs ordered are listed, but only abnormal results are displayed) Labs Reviewed  SARS CORONAVIRUS 2 (TAT 6-24 HRS)    EKG   Radiology No results found.  Procedures Procedures (including critical care time)  Medications Ordered in UC Medications - No data to display  Initial Impression / Assessment and Plan / UC Course  I have reviewed the triage vital signs and the nursing notes.  Pertinent labs & imaging results that were available during my care of the patient were reviewed  by me and considered in my medical decision making (see chart for details).   19 year old female with a history of hidradenitis suppurativa and elevated BMI presenting for evaluation of headache and myalgias.  Afebrile, well-appearing, and neurologically intact on exam however with tense trapezius musculature.  Likely viral syndrome (especially with concurrent diarrhea since today), however could also consider tension with tension-like headache as well.  Covid swabbed, result pending.   Recommended supportive care including hydration, Tylenol/ibuprofen, ice/heat, and massage.  Can trial Excedrin HA.  Will f/u Covid results.  Provided reassurance for thickened axilla tissue without evidence of discrete mass or abscess.  RTC precautions discussed.  Recommended follow-up with PCP regardless for monitoring elevated blood pressure and intermittent A1c/lipid screening given body habitus.  Final Clinical Impressions(s) / UC Diagnoses   Final diagnoses:  Viral syndrome  Acute non intractable tension-type headache     Discharge Instructions     It was wonderful seeing you today. Try a heating pad on your neck and ice/heat on your head.  It is very important that you stay hydrated, drinking plenty of water. You can continue Tylenol alternated with ibuprofen to help with your muscle aches and headache. Warm shower/baths also may help with epsom salt.  We have swabbed you for Covid today, should hopefully be back in the next day or so.  Please stay at home during this time.  Make sure you are washing her hands frequently and wearing a mask when able. Please follow-up if you are not feeling better in the next few days or sooner if persistent fever, headache not improving with the above, any weakness/numbness.   Also important that you still follow-up with your primary care provider in the next 1-2 weeks to check your blood pressure.  It was elevated today and has been elevated a few times during recent  urgent care/ED visits.  ED Prescriptions    None     PDMP not reviewed this encounter.   Allayne Stack, DO 04/27/20 1656

## 2020-04-27 NOTE — ED Triage Notes (Signed)
Patient reports going from pcp to the hospital and back for 2-3 weeks.  Patient has right shoulder pain, lymph nodes swollen, initially.  Yesterday, noticed tightness under right arm, patient reports there is a boil to right axilla and has a headache

## 2020-04-27 NOTE — ED Notes (Signed)
Collected covid sample, labeled and placed in lab

## 2020-04-27 NOTE — Discharge Instructions (Addendum)
It was wonderful seeing you today. Try a heating pad on your neck and ice/heat on your head.  It is very important that you stay hydrated, drinking plenty of water. You can continue Tylenol alternated with ibuprofen to help with your muscle aches and headache. Warm shower/baths also may help with epsom salt.  We have swabbed you for Covid today, should hopefully be back in the next day or so.  Please stay at home during this time.  Make sure you are washing her hands frequently and wearing a mask when able. Please follow-up if you are not feeling better in the next few days or sooner if persistent fever, headache not improving with the above, any weakness/numbness.   Also important that you still follow-up with your primary care provider in the next 1-2 weeks to check your blood pressure.  It was elevated today and has been elevated a few times during recent urgent care/ED visits.

## 2020-04-28 LAB — SARS CORONAVIRUS 2 (TAT 6-24 HRS): SARS Coronavirus 2: NEGATIVE

## 2020-06-05 ENCOUNTER — Other Ambulatory Visit: Payer: Self-pay | Admitting: Surgery

## 2020-06-05 DIAGNOSIS — L732 Hidradenitis suppurativa: Secondary | ICD-10-CM | POA: Diagnosis not present

## 2020-06-15 DIAGNOSIS — L080 Pyoderma: Secondary | ICD-10-CM | POA: Diagnosis not present

## 2020-06-15 DIAGNOSIS — M7918 Myalgia, other site: Secondary | ICD-10-CM | POA: Diagnosis not present

## 2020-06-15 DIAGNOSIS — R634 Abnormal weight loss: Secondary | ICD-10-CM | POA: Diagnosis not present

## 2020-06-15 DIAGNOSIS — R0789 Other chest pain: Secondary | ICD-10-CM | POA: Diagnosis not present

## 2020-07-22 DIAGNOSIS — M25512 Pain in left shoulder: Secondary | ICD-10-CM | POA: Diagnosis not present

## 2020-07-22 DIAGNOSIS — R7309 Other abnormal glucose: Secondary | ICD-10-CM | POA: Diagnosis not present

## 2020-07-22 DIAGNOSIS — M25511 Pain in right shoulder: Secondary | ICD-10-CM | POA: Diagnosis not present

## 2020-07-22 DIAGNOSIS — Z0000 Encounter for general adult medical examination without abnormal findings: Secondary | ICD-10-CM | POA: Diagnosis not present

## 2020-07-22 DIAGNOSIS — Z7689 Persons encountering health services in other specified circumstances: Secondary | ICD-10-CM | POA: Diagnosis not present

## 2020-07-22 DIAGNOSIS — Z0001 Encounter for general adult medical examination with abnormal findings: Secondary | ICD-10-CM | POA: Diagnosis not present

## 2020-07-22 DIAGNOSIS — M542 Cervicalgia: Secondary | ICD-10-CM | POA: Diagnosis not present

## 2020-07-22 DIAGNOSIS — Z118 Encounter for screening for other infectious and parasitic diseases: Secondary | ICD-10-CM | POA: Diagnosis not present

## 2020-07-22 DIAGNOSIS — Z1159 Encounter for screening for other viral diseases: Secondary | ICD-10-CM | POA: Diagnosis not present

## 2020-07-22 DIAGNOSIS — Z6841 Body Mass Index (BMI) 40.0 and over, adult: Secondary | ICD-10-CM | POA: Diagnosis not present

## 2020-07-22 DIAGNOSIS — M25519 Pain in unspecified shoulder: Secondary | ICD-10-CM | POA: Diagnosis not present

## 2020-07-27 DIAGNOSIS — Z833 Family history of diabetes mellitus: Secondary | ICD-10-CM | POA: Diagnosis not present

## 2020-07-27 DIAGNOSIS — Z0000 Encounter for general adult medical examination without abnormal findings: Secondary | ICD-10-CM | POA: Diagnosis not present

## 2020-07-27 DIAGNOSIS — M542 Cervicalgia: Secondary | ICD-10-CM | POA: Diagnosis not present

## 2020-07-27 DIAGNOSIS — Z0001 Encounter for general adult medical examination with abnormal findings: Secondary | ICD-10-CM | POA: Diagnosis not present

## 2020-08-16 ENCOUNTER — Ambulatory Visit (HOSPITAL_COMMUNITY): Payer: Self-pay

## 2020-08-31 DIAGNOSIS — M542 Cervicalgia: Secondary | ICD-10-CM | POA: Diagnosis not present

## 2020-09-18 DIAGNOSIS — H1031 Unspecified acute conjunctivitis, right eye: Secondary | ICD-10-CM | POA: Diagnosis not present

## 2020-09-24 DIAGNOSIS — M542 Cervicalgia: Secondary | ICD-10-CM | POA: Diagnosis not present

## 2020-09-30 DIAGNOSIS — M542 Cervicalgia: Secondary | ICD-10-CM | POA: Insufficient documentation

## 2020-10-06 DIAGNOSIS — M542 Cervicalgia: Secondary | ICD-10-CM | POA: Diagnosis not present

## 2020-11-02 ENCOUNTER — Emergency Department (HOSPITAL_COMMUNITY)
Admission: EM | Admit: 2020-11-02 | Discharge: 2020-11-02 | Disposition: A | Discharge status: Home / Self Care | Payer: Medicaid Other | Attending: Emergency Medicine | Admitting: Emergency Medicine

## 2020-11-02 ENCOUNTER — Other Ambulatory Visit: Payer: Self-pay

## 2020-11-02 DIAGNOSIS — M62838 Other muscle spasm: Secondary | ICD-10-CM | POA: Insufficient documentation

## 2020-11-02 DIAGNOSIS — J45909 Unspecified asthma, uncomplicated: Secondary | ICD-10-CM | POA: Diagnosis not present

## 2020-11-02 DIAGNOSIS — M542 Cervicalgia: Secondary | ICD-10-CM | POA: Diagnosis present

## 2020-11-02 DIAGNOSIS — F17290 Nicotine dependence, other tobacco product, uncomplicated: Secondary | ICD-10-CM | POA: Insufficient documentation

## 2020-11-02 MED ORDER — NAPROXEN 500 MG PO TABS: 500.0000 mg | ORAL_TABLET | Freq: Two times a day (BID) | ORAL | 0 refills | Status: DC

## 2020-11-02 MED ORDER — LIDOCAINE 5 % EX PTCH
1.0000 | MEDICATED_PATCH | CUTANEOUS | Status: DC
  Administered 2020-11-02: 1 via TRANSDERMAL
  Filled 2020-11-02: qty 1

## 2020-11-02 MED ORDER — KETOROLAC TROMETHAMINE 60 MG/2ML IM SOLN
60.0000 mg | Freq: Once | INTRAMUSCULAR | Status: AC
  Administered 2020-11-02: 60 mg via INTRAMUSCULAR
  Filled 2020-11-02: qty 2

## 2020-11-02 MED ORDER — ACETAMINOPHEN 500 MG PO TABS
1000.0000 mg | ORAL_TABLET | Freq: Once | ORAL | Status: AC
  Administered 2020-11-02: 1000 mg via ORAL
  Filled 2020-11-02: qty 2

## 2020-11-02 NOTE — ED Triage Notes (Signed)
Pt arrives with multiple complaints: knot on R shoulder/neck since last August, a headache/lightheadedness while running at work two weeks ago, arm tightness x several months, calf tightness yesterday, R eye redness yesterday, and L eye redness today.

## 2020-11-02 NOTE — ED Provider Notes (Signed)
[facility] EMERGENCY DEPARTMENT Provider Note   CSN: [id number] Arrival date & time: 11/02/20  1517     History No chief complaint on file.   Samantha Morse is a 20 y.o. female.  Patient is a 20 year old female who presents with left neck and shoulder pain. Pain started in August of 2021. Patient reports intermittent pain since then. 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 Patient denies any new trauma to the area. She denies any head trauma. She reports that she has been following with her primary doctor for pain. However, missed most recent appointment. She denies numbness or tingling in the right arm. Has not taken anything for the pain yet today. She states that this pain is chronic. She has been seen by orthopedic doctor who has performed MRI of her neck without any abnormalities.         Past Medical History:  Diagnosis Date  . Asthma    prn inhaler  . Complication of anesthesia 04/27/2015   vomiting and laryngospasm on extubation, requiring CPAP, jaw thrust and succinylcholine  . 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 Irregular periods    Hx - now on OCP daily  . Obesity   . Pollen allergy   . PONV (postoperative nausea and vomiting)     Patient Active Problem List   Diagnosis Date Noted  . 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 Peritonsillar abscess 02/18/2019  . Obesity 06/28/2016  . Hidradenitis suppurativa 06/28/2016  . Amenorrhea 06/28/2016    Past Surgical History:  Procedure Laterality Date  . HYDRADENITIS EXCISION Left 04/27/2015   Procedure: EXCISION HIDRADENITIS LEFT AXILLA [name] CLOSURE ;  Surgeon: Louisa Second, MD;  Location: [facility];  Service: Plastics;  Laterality: Left;  . HYDRADENITIS EXCISION Right 08/10/2015   Procedure: EXCISION HIDRADENITIS RIGHT AXILLA, RYAN POLLOCK CLOSURE;  Surgeon: Louisa Second, MD;  [facility];  Service: Plastics;  Laterality: Right;  . TONSILLECTOMY  03/15/2019  . TONSILLECTOMY Bilateral 03/15/2019   Procedure: TONSILLECTOMY;  Surgeon: Graylin Shiver, MD;  [facility];  Service: ENT;  Laterality: Bilateral;     OB History    Gravida  0   Para  0   Term  0   Preterm  0   AB  0   Living  0     SAB  0   IAB  0   Ectopic  0   Multiple  0   Live Births  0           Family History  Problem Relation Age of Onset  . Hypertension Maternal Grandmother   . Kidney cancer Maternal Grandmother   . Hypertension Mother   . Heart disease Mother        MI  . Pulmonary embolism Mother   . Thyroid disease Mother   . Hernia Mother   . Diabetes Maternal Uncle   . Diabetes Maternal Grandfather   . Hypertension Maternal Grandfather   . Cirrhosis Maternal Aunt     Social History   Tobacco Use  . Smoking status: Current Some Day Smoker    Types: Cigars  . Smokeless tobacco: Never Used  . Tobacco comment: Black and Mild Cigars, 2 cigars/week  Vaping Use  . Vaping Use: Never used  Substance Use Topics  . Alcohol use: No  . Drug use: No  Home Medications Prior to Admission medications   Medication Sig Start Date End Date Taking? Authorizing Provider  naproxen (NAPROSYN) 500 MG tablet Take 1 tablet (500 mg total) by mouth 2 (two) times daily. 11/02/20  Yes Marybell Robards, [place], MD  acetaminophen (TYLENOL) 325 MG tablet Take 2 tablets (650 mg total) by mouth every 6 (six) hours. 03/15/19   Marcellino, Glee Arvin, MD  albuterol (PROVENTIL HFA;VENTOLIN HFA) 108 (90 Base) MCG/ACT inhaler Inhale 1-2 puffs into the lungs every 6 (six) hours as needed for wheezing or shortness of breath. 12/03/18   Rancour, Jeannett Senior, MD  cyclobenzaprine (FLEXERIL) 10 MG tablet Take 1 tablet (10 mg total) by mouth 2 (two) times daily as needed for muscle spasms. 04/24/20   Moshe Cipro, NP  ibuprofen (ADVIL) 800 MG tablet Take 1 tablet (800 mg total) by mouth every 8  (eight) hours as needed for moderate pain. 04/24/20   Moshe Cipro, NP  loratadine (CLARITIN) 10 MG tablet Take 10 mg by mouth daily as needed for allergies.  05/23/18   [provider]  Norgestimate-Ethinyl Estradiol Triphasic 0.18/0.215/0.25 MG-25 MCG tab Take 1 tablet by mouth daily.  03/20/18   [provider]    Allergies    Bee pollen and Pollen extract  Review of Systems   Review of Systems  Constitutional: Negative for chills and fever.  HENT: Negative for ear pain and sore throat.   Eyes: Negative for pain and visual disturbance.  Respiratory: Negative for cough and shortness of breath.   Cardiovascular: Negative for chest pain and palpitations.  Gastrointestinal: Negative for abdominal pain and vomiting.  Genitourinary: Negative for dysuria and hematuria.  Musculoskeletal: Positive for neck pain. Negative for arthralgias and back pain.  Skin: Negative for color change and rash.  Neurological: Negative for seizures and syncope.  All other systems reviewed and are negative.   Physical Exam Updated Vital Signs BP (!) 143/88 (BP Location: Left Arm)   Pulse 89   Temp 98.5 F (36.9 C) (Oral)   Resp 18   SpO2 98%   Physical Exam Vitals and nursing note reviewed.  Constitutional:      General: She is not in acute distress.    Appearance: She is well-developed and well-nourished.  HENT:     Head: Normocephalic and atraumatic.  Eyes:     Conjunctiva/sclera: Conjunctivae normal.  Cardiovascular:     Rate and Rhythm: Normal rate and regular rhythm.     Heart sounds: No murmur heard.   Pulmonary:     Effort: Pulmonary effort is normal. No respiratory distress.     Breath sounds: Normal breath sounds.  Abdominal:     Palpations: Abdomen is soft.     Tenderness: There is no abdominal tenderness.  Musculoskeletal:        General: Tenderness present. No deformity or edema.     Cervical back: Neck supple.     Comments: Right trapezius  tenderness 5/5 strength of right and left shoulder abduction and adduction, flexion and extension of right elbow, and equal strong grip strength No sensation abnormalities over arms  Skin:    General: Skin is warm and dry.  Neurological:     Mental Status: She is alert.  Psychiatric:        Mood and Affect: Mood and affect normal.     ED Results / Procedures / Treatments   Labs (all labs ordered are listed, but only abnormal results are displayed) Labs Reviewed - No data to display  EKG None  Radiology No results found.  Medications Ordered in ED Medications  lidocaine (LIDODERM) 5 % 1 patch (has no administration in time range)  acetaminophen (TYLENOL) tablet 1,000 mg (has no administration in time range)  ketorolac (TORADOL) injection 60 mg (has no administration in time range)    ED Course  I have reviewed the triage vital signs and the nursing notes.  Pertinent labs & imaging results that were available during my care of the patient were reviewed by me and considered in my medical decision making (see chart for details).    MDM Rules/Calculators/A&P                          Chronic pain located in the lateral right neck and upper shoulder. Tenderness noted over trapezius. No bony tenderness. No recent trauma. Patient reports daily pain in this area since August, more today with exertional physical labor at work, which prompted her to come in. MRI performed of neck by orthopedic urgent care about one month ago and unremarkable. Based off of history and physical exam, feel that patient's presentation is most concerning for muscular strain vs muscular spasms. Feel that she would benefit from physical therapy. She will need to follow up with primary doctor for referral, and will also refer to neurology given chronicity of symptoms. Treated pain while in ED as documented above. Will prescribe naproxen and encourage exercises until she is able to follow up with PCP.  Stable for  discharge home.  Final Clinical Impression(s) / ED Diagnoses Final diagnoses:  Muscle spasm    Rx / DC Orders ED Discharge Orders         Ordered    Ambulatory referral to Neurology       Comments: An appointment is requested in approximately: 2 weeks   11/02/20 1836    naproxen (NAPROSYN) 500 MG tablet  2 times daily        11/02/20 1839           Shepherd Finnan, [place], MD 11/03/20 1413    Pricilla Loveless, MD 11/05/20 1900

## 2020-11-02 NOTE — Discharge Instructions (Addendum)
-   Please follow up with your primary doctor - Take tylenol and motrin for pain control until then

## 2020-11-06 DIAGNOSIS — M542 Cervicalgia: Secondary | ICD-10-CM | POA: Diagnosis not present

## 2020-11-06 DIAGNOSIS — R519 Headache, unspecified: Secondary | ICD-10-CM | POA: Diagnosis not present

## 2020-11-06 DIAGNOSIS — J309 Allergic rhinitis, unspecified: Secondary | ICD-10-CM | POA: Diagnosis not present

## 2020-11-06 DIAGNOSIS — M25519 Pain in unspecified shoulder: Secondary | ICD-10-CM | POA: Diagnosis not present

## 2020-11-09 DIAGNOSIS — J040 Acute laryngitis: Secondary | ICD-10-CM | POA: Diagnosis not present

## 2020-11-11 ENCOUNTER — Encounter: Payer: Self-pay | Admitting: Physical Therapy

## 2020-11-11 ENCOUNTER — Ambulatory Visit: Payer: Medicaid Other | Attending: Nurse Practitioner | Admitting: Physical Therapy

## 2020-11-11 ENCOUNTER — Other Ambulatory Visit: Payer: Self-pay

## 2020-11-11 DIAGNOSIS — M542 Cervicalgia: Secondary | ICD-10-CM | POA: Diagnosis not present

## 2020-11-11 DIAGNOSIS — M6281 Muscle weakness (generalized): Secondary | ICD-10-CM | POA: Diagnosis not present

## 2020-11-11 DIAGNOSIS — R252 Cramp and spasm: Secondary | ICD-10-CM | POA: Diagnosis not present

## 2020-11-11 NOTE — Patient Instructions (Signed)
Access Code: [email or web address] URL: [email or web address] Date: 11/11/2020 Prepared by: Lysle Rubens  Exercises Seated Scapular Retraction - 1 x daily - 7 x weekly - 2 sets - 10 reps - 3 sec hold Seated Upper Trapezius Stretch - 1 x daily - 7 x weekly - 2 sets - 2 reps - 15-20 hold Gentle Levator Scapulae Stretch - 1 x daily - 7 x weekly - 2 sets - 10 reps - 15-20 sec hold Seated Cervical Retraction - 1 x daily - 7 x weekly - 2 sets - 10 reps - 3 sec hold
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
## 2020-11-24 DIAGNOSIS — M542 Cervicalgia: Secondary | ICD-10-CM | POA: Diagnosis not present

## 2020-11-24 DIAGNOSIS — Z79899 Other long term (current) drug therapy: Secondary | ICD-10-CM | POA: Diagnosis not present

## 2020-11-24 DIAGNOSIS — Z7951 Long term (current) use of inhaled steroids: Secondary | ICD-10-CM | POA: Diagnosis not present

## 2020-11-24 DIAGNOSIS — J309 Allergic rhinitis, unspecified: Secondary | ICD-10-CM | POA: Diagnosis not present

## 2020-11-24 DIAGNOSIS — J069 Acute upper respiratory infection, unspecified: Secondary | ICD-10-CM | POA: Diagnosis not present

## 2020-11-24 DIAGNOSIS — M25519 Pain in unspecified shoulder: Secondary | ICD-10-CM | POA: Diagnosis not present

## 2020-11-26 ENCOUNTER — Other Ambulatory Visit: Payer: Self-pay

## 2020-11-26 ENCOUNTER — Encounter: Payer: Self-pay | Admitting: Physical Therapy

## 2020-11-26 ENCOUNTER — Ambulatory Visit: Payer: Medicaid Other | Admitting: Physical Therapy

## 2020-11-26 DIAGNOSIS — M542 Cervicalgia: Secondary | ICD-10-CM

## 2020-11-26 DIAGNOSIS — M6281 Muscle weakness (generalized): Secondary | ICD-10-CM

## 2020-11-26 DIAGNOSIS — R252 Cramp and spasm: Secondary | ICD-10-CM | POA: Diagnosis not present

## 2020-11-26 NOTE — Therapy (Signed)
[facility] [address] [place], [place], [address] Phone: [phone]   Fax:  [phone]  Physical Therapy Treatment  Patient Details  Name: Samantha Morse MRN: [id number] Date of Birth: 2001-03-07 Referring Provider (PT): Yetta Barre   Encounter Date: 11/26/2020   PT End of Session - 11/26/20 1133    Visit Number 2    Date for PT Re-Evaluation 01/11/21    PT Start Time 1059    PT Stop Time 1145    PT Time Calculation (min) 46 min    Activity Tolerance Patient tolerated treatment well    Behavior During Therapy [facility] for tasks assessed/performed           Past Medical History:  Diagnosis Date  . Asthma    prn inhaler  . Complication of anesthesia 04/27/2015   vomiting and laryngospasm on extubation, requiring CPAP, jaw thrust and succinylcholine  . 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 Irregular periods    Hx - now on OCP daily  . Obesity   . Pollen allergy   . PONV (postoperative nausea and vomiting)     Past Surgical History:  Procedure Laterality Date  . HYDRADENITIS EXCISION Left 04/27/2015   Procedure: EXCISION HIDRADENITIS LEFT AXILLA [name] CLOSURE ;  Surgeon: Louisa Second, MD;  Location: [facility];  Service: Plastics;  Laterality: Left;  . HYDRADENITIS EXCISION Right 08/10/2015   Procedure: EXCISION HIDRADENITIS RIGHT AXILLA, RYAN POLLOCK CLOSURE;  Surgeon: Louisa Second, MD;  Location: [facility];  Service: Plastics;  Laterality: Right;  . TONSILLECTOMY  03/15/2019  . TONSILLECTOMY Bilateral 03/15/2019   Procedure: TONSILLECTOMY;  Surgeon: Graylin Shiver, MD;  Location: [facility] OR;  Service: ENT;  Laterality: Bilateral;    There were no vitals filed for this visit.   Subjective Assessment - 11/26/20 1107    Subjective Pt reports ex's are going well. Saw MD and received steroid injection and states that this helped. Is concerned about N/T in R side; has  neurologist appt scheduled for May 2022    Currently in Pain? Yes    Pain Score 4     Pain Location Neck    Pain Orientation Right                             [facility] Adult PT Treatment/Exercise - 11/26/20 0001      Neck Exercises: Machines for Strengthening   UBE (Upper Arm Bike) L1.5 x3 min each    Cybex Row 20# 2x10    Cybex Chest Press 15# 2x10 with serratus push    Lat Pull 20# 2x10      Neck Exercises: Standing   Other Standing Exercises scap stab 3 ways with red TB x5 B      Neck Exercises: Seated   Neck Retraction 20 reps;3 secs    Neck Retraction Limitations into exercise ball      Modalities   Modalities Electrical Stimulation;Moist Heat      Moist Heat Therapy   Number Minutes Moist Heat 12 Minutes    Moist Heat Location Cervical      Electrical Stimulation   Electrical Stimulation Location Cervical/UT    Electrical Stimulation Action IFC    Electrical Stimulation Parameters seated    Electrical Stimulation Goals Pain      Neck Exercises: Stretches   Upper Trapezius Stretch Right;Left;1 rep;20 seconds    Levator Stretch Right;Left;1 rep;20  seconds                    PT Short Term Goals - 11/26/20 1135      PT SHORT TERM GOAL #1   Title Pt will be I with initial HEP    Time 2    Period Weeks    Status Achieved    Target Date 11/25/20             PT Long Term Goals - 11/11/20 1711      PT LONG TERM GOAL #1   Title Pt will be I with advanced HEP    Time 6    Period Weeks    Status New    Target Date 12/23/20      PT LONG TERM GOAL #2   Title Pt will report resolution of RUE radiating pain    Time 6    Period Weeks    Status New    Target Date 12/23/20      PT LONG TERM GOAL #3   Title Pt will report 50% reduction in cervical pain    Time 6    Period Weeks    Status New    Target Date 12/23/20      PT LONG TERM GOAL #4   Title Pt will understand posture and body mechanics    Time 6    Period Weeks     Status New    Target Date 12/23/20                 Plan - 11/26/20 1133    Clinical Impression Statement Pt with difficulty tolerating ex's this rx d/t increase in R sided cervical pain with increase in ant shoulder pain this rx. Light strengthening on machines with some scap stab ex's. Cues for posture/form. Pt does report relief with cervical stretching and neck retraction. MHP and estim at end for pain relief. Pt interested in DN for B UT but would like to research it first; follow up on this next rx.    PT Treatment/Interventions ADLs/Self Care Home Management;Electrical Stimulation;Iontophoresis 4mg /ml Dexamethasone;Moist Heat;Therapeutic activities;Therapeutic exercise;Neuromuscular re-education;Manual techniques;Patient/family education;Passive range of motion;Dry needling;Taping    PT Next Visit Plan cervical ROM/flexibility, scap stab, manual to UT/cervical    Consulted and Agree with Plan of Care Patient           Patient will benefit from skilled therapeutic intervention in order to improve the following deficits and impairments:  Decreased range of motion,Increased muscle spasms,Impaired UE functional use,Decreased activity tolerance,Pain,Impaired flexibility,Postural dysfunction  Visit Diagnosis: Neck pain  Muscle weakness (generalized)  Cramp and spasm     Problem List Patient Active Problem List   Diagnosis Date Noted  . 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 Peritonsillar abscess 02/18/2019  . Obesity 06/28/2016  . Hidradenitis suppurativa 06/28/2016  . Amenorrhea 06/28/2016   06/30/2016, PT, DPT Lysle Rubens Merwyn Hodapp 11/26/2020, 11:36 AM  [facility] [address] [place], [place], [place] Phone: [phone]   Fax:  [phone]  Name: KELISHA DALL MRN: Fernande Bras Date of Birth: 2001/06/12

## 2020-11-26 NOTE — Patient Instructions (Signed)

## 2020-12-02 ENCOUNTER — Ambulatory Visit: Payer: Medicaid Other | Attending: Nurse Practitioner | Admitting: Physical Therapy

## 2020-12-02 ENCOUNTER — Other Ambulatory Visit: Payer: Self-pay

## 2020-12-02 ENCOUNTER — Encounter: Payer: Self-pay | Admitting: Physical Therapy

## 2020-12-02 DIAGNOSIS — M6281 Muscle weakness (generalized): Secondary | ICD-10-CM | POA: Diagnosis not present

## 2020-12-02 DIAGNOSIS — M542 Cervicalgia: Secondary | ICD-10-CM | POA: Diagnosis not present

## 2020-12-02 DIAGNOSIS — R252 Cramp and spasm: Secondary | ICD-10-CM | POA: Diagnosis not present

## 2020-12-02 NOTE — Therapy (Signed)
[facility] [address] [place], [place], [address] Phone: [phone]   Fax:  [phone]  Physical Therapy Treatment  Patient Details  Name: Samantha Morse MRN: [id number] Date of Birth: 27-May-2001 Referring Provider (PT): Yetta Barre   Encounter Date: 12/02/2020   PT End of Session - 12/02/20 1102    Visit Number 3    Date for PT Re-Evaluation 01/11/21    PT Start Time 1018    PT Stop Time 1100    PT Time Calculation (min) 42 min    Activity Tolerance Patient tolerated treatment well    Behavior During Therapy [facility] for tasks assessed/performed           Past Medical History:  Diagnosis Date  . Asthma    prn inhaler  . Complication of anesthesia 04/27/2015   vomiting and laryngospasm on extubation, requiring CPAP, jaw thrust and succinylcholine  . 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 Irregular periods    Hx - now on OCP daily  . Obesity   . Pollen allergy   . PONV (postoperative nausea and vomiting)     Past Surgical History:  Procedure Laterality Date  . HYDRADENITIS EXCISION Left 04/27/2015   Procedure: EXCISION HIDRADENITIS LEFT AXILLA [name] CLOSURE ;  Surgeon: Louisa Second, MD;  Location: [facility];  Service: Plastics;  Laterality: Left;  . HYDRADENITIS EXCISION Right 08/10/2015   Procedure: EXCISION HIDRADENITIS RIGHT AXILLA, RYAN POLLOCK CLOSURE;  Surgeon: Louisa Second, MD;  Location: [facility];  Service: Plastics;  Laterality: Right;  . TONSILLECTOMY  03/15/2019  . TONSILLECTOMY Bilateral 03/15/2019   Procedure: TONSILLECTOMY;  Surgeon: Graylin Shiver, MD;  Location[facility];  Service: ENT;  Laterality: Bilateral;    There were no vitals filed for this visit.   Subjective Assessment - 12/02/20 1024    Subjective Pt states she is having increased cervical tightness/pain on R side. Is hestitant to try DN; wants to wait until she sees neurologist. Does report  some dizziness with transitional movements with occasional room spinning sensation, will do some vestibular testing to determine if peripheral cause.    Currently in Pain? Yes    Pain Score 7     Pain Location Neck    Pain Orientation Right                   Vestibular Assessment - 12/02/20 0001      Symptom Behavior   Type of Dizziness  Imbalance;Unsteady with head/body turns    Frequency of Dizziness a few times a week    Duration of Dizziness <10 seconds    Symptom Nature Spontaneous;Motion provoked    Aggravating Factors Spontaneous onset;Activity in general;Forward bending;Mornings    Relieving Factors Rest;Slow movements    Progression of Symptoms Worse    History of similar episodes no hx of dizziness      Oculomotor Exam   Oculomotor Alignment Normal    Ocular ROM WFL    Spontaneous Absent    Gaze-induced  Absent    Head shaking Horizontal --   no nystagmus, reports of "foggy" feeling in head   Head Shaking Vertical --   no nystagmus, reports of "foggy" feeling in head   Smooth Pursuits Saccades    Saccades Slow    Comment reports some difficutly concentrating on/following with oculomotor ROM      Oculomotor Exam-Fixation Suppressed    Left Head Impulse neg; some reports of "foggy"  feeling    Right Head Impulse neg; some reports of "foggy" feeling      Vestibulo-Ocular Reflex   VOR Cancellation Corrective saccades    Comment no nystagmus, reports of "foggy" feeling in head      Positional Testing   Dix-Hallpike Dix-Hallpike Right;Dix-Hallpike Left    Horizontal Canal Testing Horizontal Canal Right;Horizontal Canal Left      Dix-Hallpike Right   Dix-Hallpike Right Duration negative      Dix-Hallpike Left   Dix-Hallpike Left Duration negative      Positional Sensitivities   Sit to Supine No dizziness    Up from Right Hallpike No dizziness    Up from Left Hallpike No dizziness    Head Turning x 5 Mild dizziness    Head Nodding x 5 Mild dizziness                     [facility] Adult PT Treatment/Exercise - 12/02/20 0001      Neck Exercises: Machines for Strengthening   UBE (Upper Arm Bike) L 1.5 x3 min each    Nustep L5 x 4 min                    PT Short Term Goals - 11/26/20 1135      PT SHORT TERM GOAL #1   Title Pt will be I with initial HEP    Time 2    Period Weeks    Status Achieved    Target Date 11/25/20             PT Long Term Goals - 11/11/20 1711      PT LONG TERM GOAL #1   Title Pt will be I with advanced HEP    Time 6    Period Weeks    Status New    Target Date 12/23/20      PT LONG TERM GOAL #2   Title Pt will report resolution of RUE radiating pain    Time 6    Period Weeks    Status New    Target Date 12/23/20      PT LONG TERM GOAL #3   Title Pt will report 50% reduction in cervical pain    Time 6    Period Weeks    Status New    Target Date 12/23/20      PT LONG TERM GOAL #4   Title Pt will understand posture and body mechanics    Time 6    Period Weeks    Status New    Target Date 12/23/20                 Plan - 12/02/20 1103    Clinical Impression Statement Pt presents to clinic reporting increased R sided cervical pain along with increase in instances of dizziness. Pt describes dizziness both of spontaneous onset and associated with quick movements bending forward and rising from bed in the morning. In clinic, pt demos occulomotor exam with catch up saccades along with increase in "foggy" feeling with VOR ex's. No nystagmus observed and neg Dix-Hallpike/Horizontal test B. Educated pt on potential for cervicogenic dizziness along with vestibular hypofunction and importance of habituation ex's. Prescirbed seated habituation ex's with pt demo understanding. Pt has neuro appt scheduled in May to address cervical radiculopathy and sx of dizziness; will continue with PT over the next few weeks to see if we can make progress.    PT Treatment/Interventions ADLs/Self  Care  Home Management;Electrical Stimulation;Iontophoresis 4mg /ml Dexamethasone;Moist Heat;Therapeutic activities;Therapeutic exercise;Neuromuscular re-education;Manual techniques;Patient/family education;Passive range of motion;Dry needling;Taping    PT Next Visit Plan review/progress habituation ex's, balance testing, cervical ROM/flexibility, scap stab, manual to UT/cervical    Consulted and Agree with Plan of Care Patient           Patient will benefit from skilled therapeutic intervention in order to improve the following deficits and impairments:  Decreased range of motion,Increased muscle spasms,Impaired UE functional use,Decreased activity tolerance,Pain,Impaired flexibility,Postural dysfunction  Visit Diagnosis: Neck pain  Muscle weakness (generalized)  Cramp and spasm     Problem List Patient Active Problem List   Diagnosis Date Noted  . 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 Peritonsillar abscess 02/18/2019  . Obesity 06/28/2016  . Hidradenitis suppurativa 06/28/2016  . Amenorrhea 06/28/2016   06/30/2016, PT, DPT Lysle Rubens Klarisa Barman 12/02/2020, 11:43 AM  [facility] [address] [place], [place], [place] Phone: [phone]   Fax:  [phone]  Name: JACLYNE HAVERSTICK MRN: Fernande Bras Date of Birth: 09-13-2000

## 2020-12-09 ENCOUNTER — Ambulatory Visit: Payer: Medicaid Other | Admitting: Physical Therapy

## 2020-12-17 ENCOUNTER — Ambulatory Visit: Payer: Medicaid Other | Admitting: Physical Therapy

## 2021-01-06 IMAGING — CT CT NECK WITH CONTRAST
3 of 4 series · 13 of 33 positions shown, 16 images · IV contrast (Omni 300)
Comparison: None.

CLINICAL DATA: Sore throat/stridor, epiglottitis or tonsillitis
suspected. Strep throat diagnosed 2 weeks ago

EXAM:
CT NECK WITH CONTRAST
TECHNIQUE: Multidetector CT imaging of the neck was performed using the
standard protocol following the bolus administration of intravenous
contrast.
CONTRAST:  100mL OMNIPAQUE IOHEXOL 300 MG/ML  SOLN

[Series 5: neck 2.0 st · sagittal · 0.42mm/px · 5 of 101 slices shown, 6 images (1 of 2)]
[im 34/101  bone]
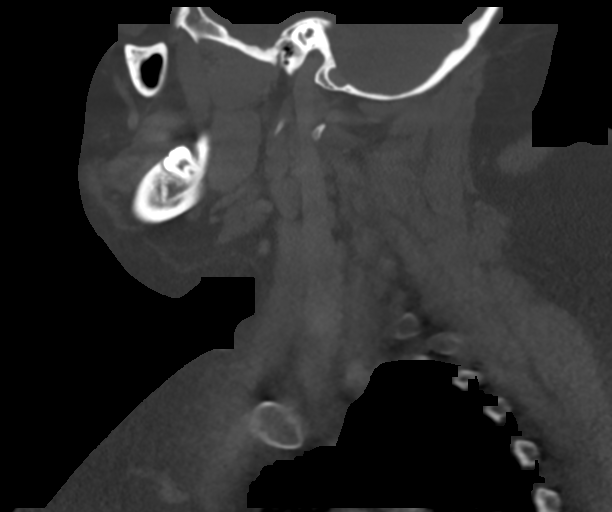
[im 42/101  bone]
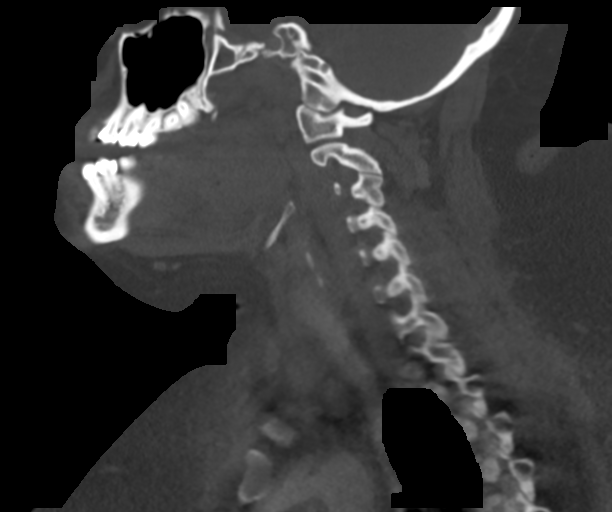
[im 51/101  soft-tissue]
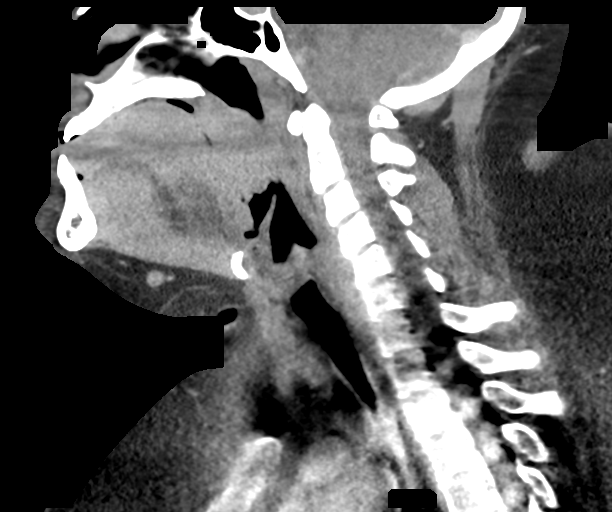
[im 51/101  bone]
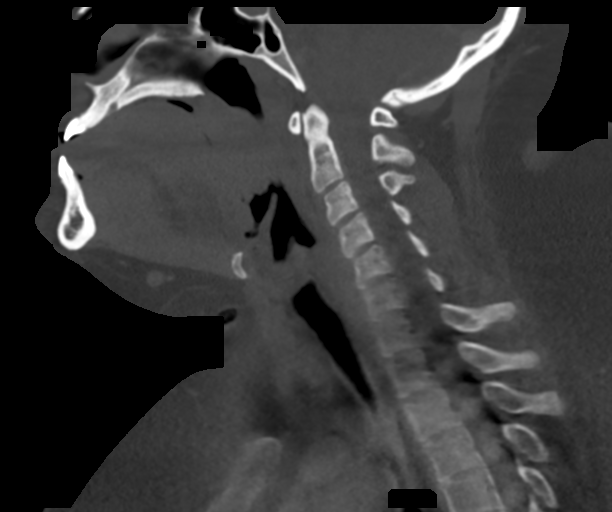
[im 59/101  bone]
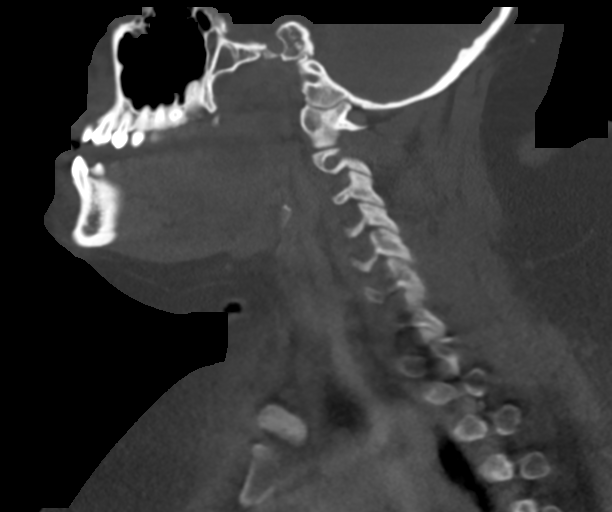
[im 67/101  bone]
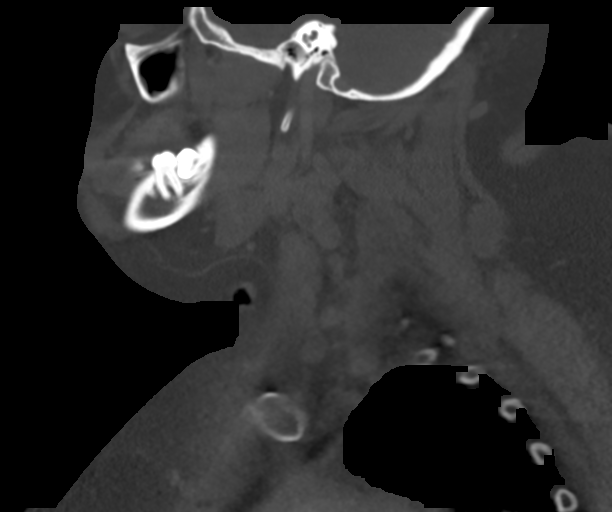

[Series 6: neck 2.0 st · coronal · 0.42mm/px · 3 of 121 slices shown (2 of 2)]
[im 25/121  bone]
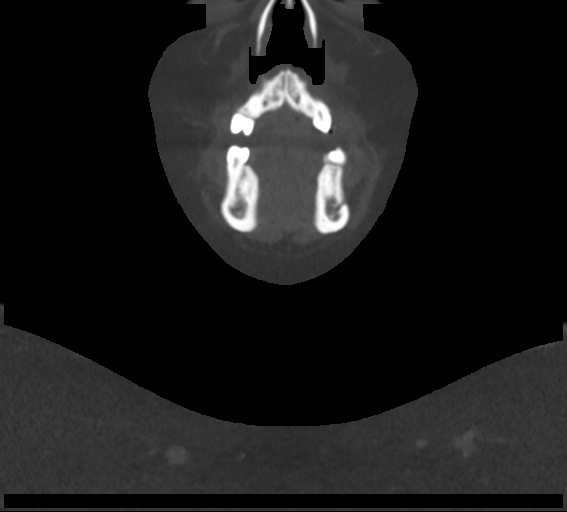
[im 49/121  bone]
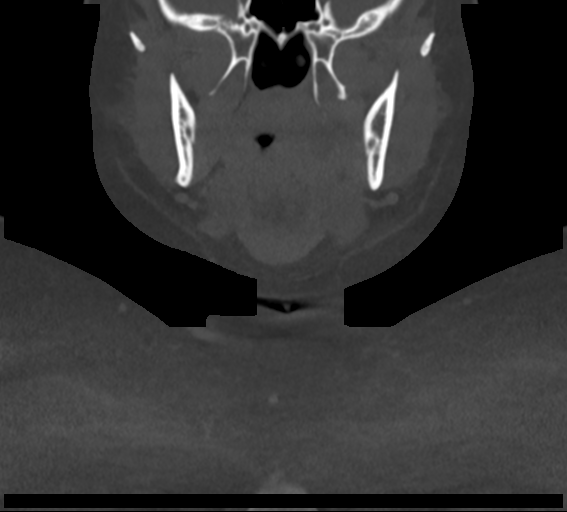
[im 73/121  bone]
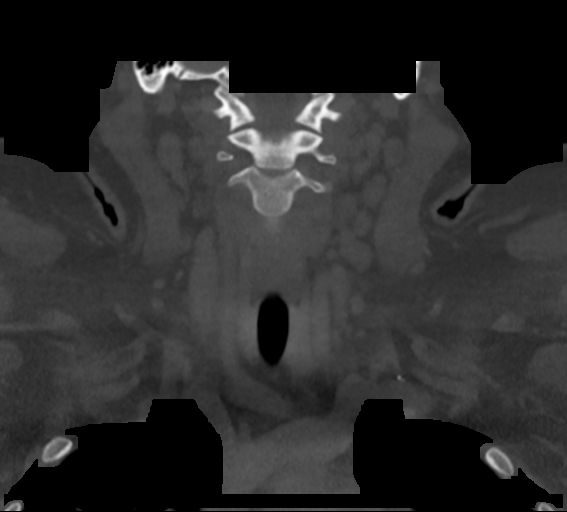

[Series 7: neck 2.0 st orthogonal · axial · 0.39mm/px · z∈[-257,-95]mm · 5 of 109 slices shown, 7 images]
[im 14/109  soft-tissue]
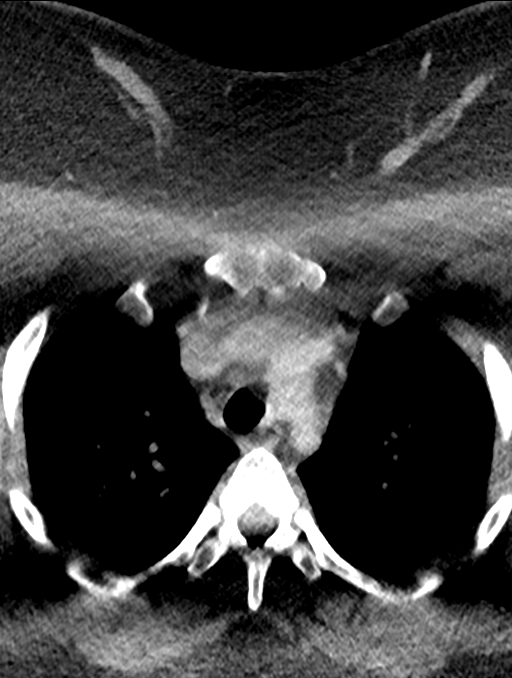
[im 14/109  bone]
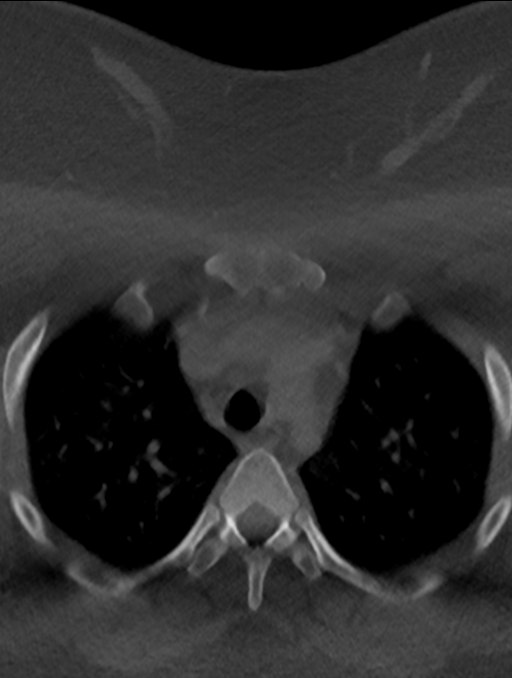
[im 41/109  bone]
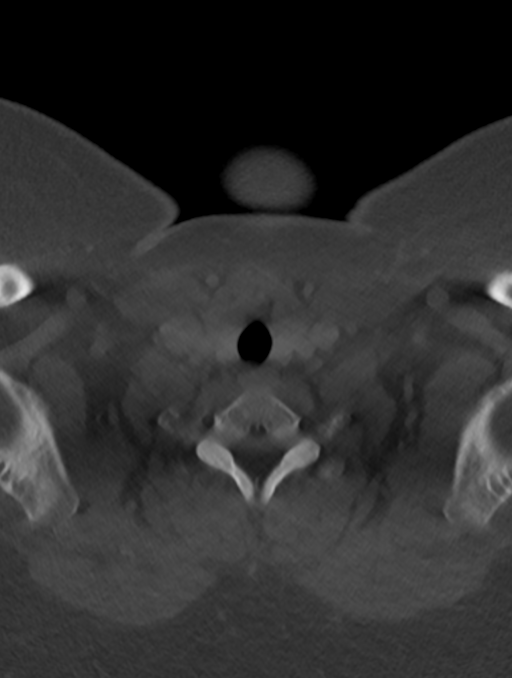
[im 55/109  bone]
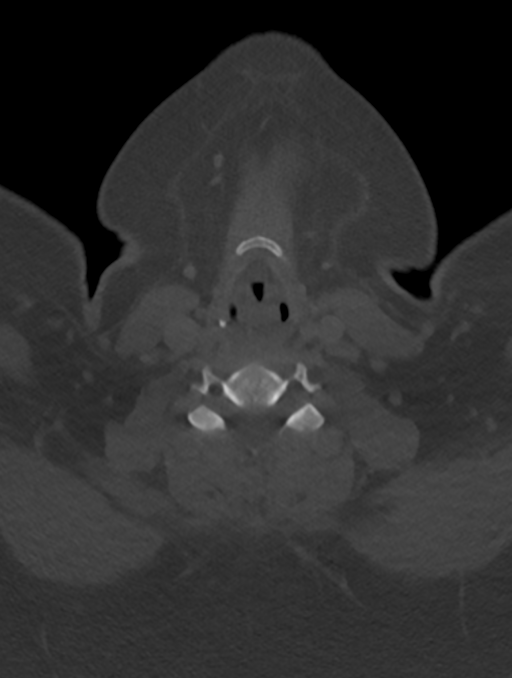
[im 68/109  bone]
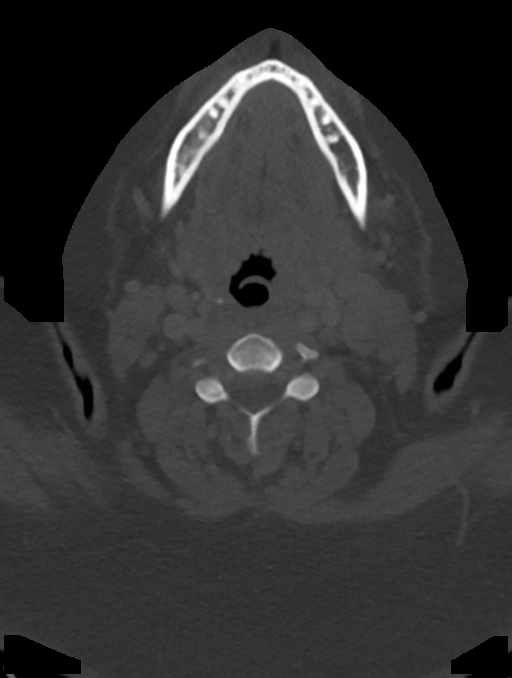
[im 95/109  soft-tissue]
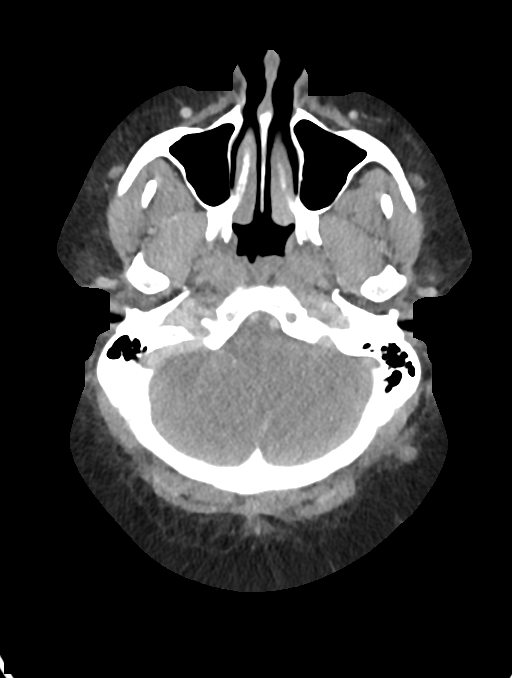
[im 95/109  bone]
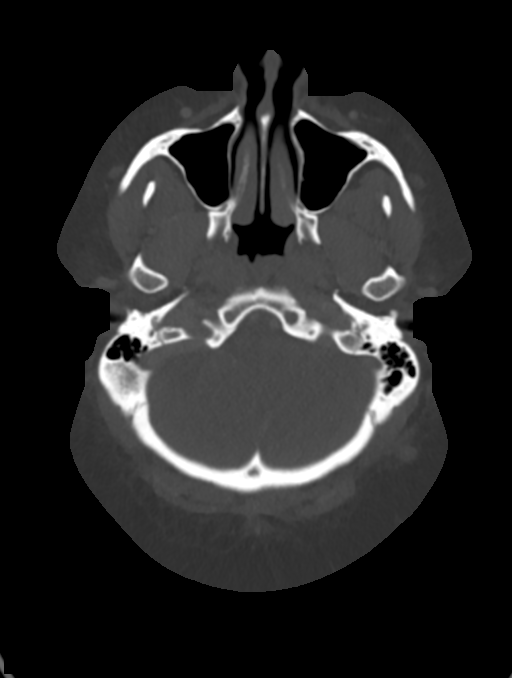

[13 of 33 positions shown; findings below may reference images not displayed]

FINDINGS: Pharynx and larynx: Thickening of the tonsils with a 2 cm left
peritonsillar collection deforming the oropharynx. The supraglottic
larynx is negative for swelling or airway narrowing. No
retropharyngeal abscess.

Salivary glands: No inflammation, mass, or stone.

Thyroid: Normal as permitted by streak artifact

Lymph nodes: Homogeneous nodal enlargement on the left more than
right attributed to adenitis. No cavitation.

Vascular: Negative for venous thrombosis

Limited intracranial: Negative

Visualized orbits: Negative

Mastoids and visualized paranasal sinuses: Clear

Skeleton: Negative

Upper chest: Negative
IMPRESSION: 1. Tonsillitis with 2 cm left peritonsillar abscess.
2. Cervical adenitis.

## 2021-01-06 NOTE — Progress Notes (Addendum)
[facility]    Provider:  Dr Lucia Gaskins Requesting Provider: [facility] Iona Hansen, NP Primary Care Provider:  Iona Hansen, NP  CC:  Headaches  HPI:  Samantha Morse is a 20 y.o. female here as requested by [facility] for headaches. PMHx asthma, obesity. I reviewed Iona Hansen, NP's notes: Physical examination showed enlarged turbinates boggy and right range of motion decreased in the neck, left range of motion in the neck normal, tender right trapezius with no deformity otherwise normal including cardiovascular, pulmonary, neurologic.  She was given baclofen and tramadol for neck and shoulder pain and for her chronic non-intractable episodic headache.  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  Diagnosed with muscular strain versus spasms and referred to physical therapy.No other focal neurologic deficits, associated symptoms, inciting events or modifiable factors.  Her neck started getting tight since last year, mostly on the right, in the traps mostly on the right, very tight, once that get tight her head starts hurting. Last week she was dizzy, thought she was having a stroke, if she moved too fast she felt faint, been happening more, the headache feels like someone is squeezing her brain, the worst ones are on the right, she has to turn the lights off, sleep helps, nausea, photophobia, movement makes it worse. She has the neck pain all the time and osmetimes it trigger the headaches. She has been to physical therapy. She is a Licensed conveyancer at [employer], physical job. Neck  pain is every day. Out of a week she has 2 headaches a week. If she doesn't get herself to sleep it is at least 4 hours may - 24 hours, she often has other headaches as well. Triggers include missing meals, or dehyradration. Associated dizziness. The headaches are positional, worse bending over she may faint, her vision goes "in and out" and trouble focusing, blurs out, she has numbness of the right arm and fingers. Had an MRI of the cervical spine at [facility] (will request). She can wake up with the headaches.   Reviewed notes, labs and imaging from outside physicians, which showed:  Cbc nml, cmp unremarkable 06/2020  Medications tried in the past for headaches and neck pain: tylenol, ibuprofen, tramadol, baclofen, prednisone, reglan, flexeril, naproxen, zofran,   06/2017 XR cervical: IMPRESSION: reviewed images Anterior subluxation of C4 on C5 seen only on the swimmer's view with normal alignment of the neutral view. This suggests instability which could be due to occult fracture or ligamentous injury. CT suggested for further evaluation.  Review of Systems: Patient complains of symptoms per HPI as well as the following symptoms neck pain, headache. Pertinent negatives and positives per HPI. All others negative.   Social History   Socioeconomic History  . Marital status: Significant Other    Spouse name: Not on file  . Number of children: 0  . Years of education: Not on file  . Highest education level: Some college, no degree  Occupational History  . Not on file  Tobacco Use  . Smoking status: Former Smoker  Types: Cigars  . Smokeless tobacco: Never Used  . Tobacco comment: Black and Mild Cigars, 2 cigars/week  Vaping Use  . Vaping Use: Never used  Substance and Sexual Activity  . Alcohol use: Yes    Comment: socially  . Drug use: Yes    Types: Marijuana    Comment: 01/07/21 1-2 x daily  . Sexual activity: Yes    Birth control/protection: Pill  Other Topics Concern   . Not on file  Social History Narrative   Lives with sig other   Social Determinants of Health   Financial Resource Strain: Not on file  Food Insecurity: Not on file  Transportation Needs: Not on file  Physical Activity: Not on file  Stress: Not on file  Social Connections: Not on file  Intimate Partner Violence: Not on file    Family History  Problem Relation Age of Onset  . Hypertension Maternal Grandmother   . Kidney cancer Maternal Grandmother   . Hypertension Mother   . Heart disease Mother        MI  . Pulmonary embolism Mother   . Thyroid disease Mother   . Hernia Mother   . Diabetes Maternal Uncle   . Diabetes Maternal Grandfather   . Hypertension Maternal Grandfather   . Cirrhosis Maternal Aunt     Past Medical History:  Diagnosis Date  . Asthma    prn inhaler  . Complication of anesthesia 04/27/2015   vomiting and laryngospasm on extubation, requiring CPAP, jaw thrust and succinylcholine  . 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 Irregular periods    Hx - now on OCP daily  . Obesity   . Pollen allergy   . PONV (postoperative nausea and vomiting)     Patient Active Problem List   Diagnosis Date Noted  . Migraine without aura and without status migrainosus, not intractable 01/07/2021  . Cervical myofascial pain syndrome 01/07/2021  . 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 Peritonsillar abscess 02/18/2019  . Obesity 06/28/2016  . Hidradenitis suppurativa 06/28/2016  . Amenorrhea 06/28/2016    Past Surgical History:  Procedure Laterality Date  . HYDRADENITIS EXCISION Left 04/27/2015   Procedure: EXCISION HIDRADENITIS LEFT AXILLA [name] CLOSURE ;  Surgeon: Louisa Second, MD;  Location: [facility];  Service: Plastics;  Laterality: Left;  . HYDRADENITIS EXCISION Right 08/10/2015   Procedure: EXCISION HIDRADENITIS RIGHT AXILLA, RYAN POLLOCK CLOSURE;  Surgeon: Louisa Second, MD;  Location: [facility];  Service:  Plastics;  Laterality: Right;  . TONSILLECTOMY Bilateral 03/15/2019   Procedure: TONSILLECTOMY;  Surgeon: Graylin Shiver, MD;  Location: [facility] OR;  Service: ENT;  Laterality: Bilateral;    Current Outpatient Medications  Medication Sig Dispense Refill  . acetaminophen (TYLENOL) 325 MG tablet Take 2 tablets (650 mg total) by mouth every 6 (six) hours.    Marland Kitchen albuterol (PROVENTIL HFA;VENTOLIN HFA) 108 (90 Base) MCG/ACT inhaler Inhale 1-2 puffs into the lungs every 6 (six) hours as needed for wheezing or shortness of breath. 1 Inhaler 0  . Baclofen 5 MG TABS Take 1 tablet by mouth 3 (three) times daily as needed.    . cyclobenzaprine (FLEXERIL) 10 MG tablet Take 1 tablet (10 mg total) by mouth 2 (two) times daily as needed for muscle spasms. 20 tablet 0  . gabapentin (NEURONTIN) 300 MG capsule Take 1 capsule (300 mg total) by mouth 3 (three) times daily. 90 capsule 11  . ibuprofen (ADVIL) 800 MG tablet Take 1 tablet (800 mg  total) by mouth every 8 (eight) hours as needed for moderate pain. 21 tablet 0  . loratadine (CLARITIN) 10 MG tablet Take 10 mg by mouth daily as needed for allergies.   11  . methocarbamol (ROBAXIN) 500 MG tablet Take 1 tablet (500 mg total) by mouth 3 (three) times daily. 90 tablet 3  . naproxen (NAPROSYN) 500 MG tablet Take 1 tablet (500 mg total) by mouth 2 (two) times daily. 30 tablet 0  . Norgestimate-Ethinyl Estradiol Triphasic 0.18/0.215/0.25 MG-25 MCG tab Take 1 tablet by mouth daily.   6  . rizatriptan (MAXALT-MLT) 10 MG disintegrating tablet Take 1 tablet (10 mg total) by mouth as needed for migraine. May repeat in 2 hours if needed 9 tablet 11  . traMADol (ULTRAM) 50 MG tablet Take 50 mg by mouth every 8 (eight) hours as needed.     No current facility-administered medications for this visit.    Allergies as of 01/07/2021 - Review Complete 01/07/2021  Allergen Reaction Noted  . Bee pollen  12/04/2014  . Pollen extract Other (See Comments) 12/04/2014     Vitals: BP 134/83   Pulse 81   Ht 5\' 4"  (1.626 m)   Wt (!) 329 lb (149.2 kg)   BMI 56.47 kg/m  Last Weight:  Wt Readings from Last 1 Encounters:  01/07/21 (!) 329 lb (149.2 kg)   Last Height:   Ht Readings from Last 1 Encounters:  01/07/21 5\' 4"  (1.626 m)     Physical exam: Exam: Gen: NAD, conversant, well nourised, obese, well groomed                     CV: RRR, no MRG. No Carotid Bruits. No peripheral edema, warm, nontender Eyes: Conjunctivae clear without exudates or hemorrhage  Neuro: Detailed Neurologic Exam  Speech:    Speech is normal; fluent and spontaneous with normal comprehension.  Cognition:    The patient is oriented to person, place, and time;     recent and remote memory intact;     language fluent;     normal attention, concentration,     fund of knowledge Cranial Nerves:    The pupils are equal, round, and reactive to light. The fundi are normal and spontaneous venous pulsations are present. Visual fields are full to finger confrontation. Extraocular movements are intact. Trigeminal sensation is intact and the muscles of mastication are normal. The face is symmetric. The palate elevates in the midline. Hearing intact. Voice is normal. Shoulder shrug is normal. The tongue has normal motion without fasciculations.   Coordination:    Normal finger to nose and heel to shin. Normal rapid alternating movements.   Gait:    Heel-toe and tandem gait are normal.   Motor Observation:    No asymmetry, no atrophy, and no involuntary movements noted. Tone:    Normal muscle tone.    Posture:    Posture is normal. normal erect    Strength:    Strength is V/V in the upper and lower limbs.      Sensation: intact to LT     Reflex Exam:  DTR's:    Deep tendon reflexes in the upper and lower extremities are normal bilaterally.   Toes:    The toes are downgoing bilaterally.   Clonus:    Clonus is absent.    Assessment/Plan:  20 year old with  myofascial cervical pain triggering migraines. However due to concerning symptoms needs MRI of the brain. Had MRI of  the cervical spine at [facility] will request CD and results.   Addendum Jan 11, 2021: I reviewed MRI report of the cervical spine from [facility], at every level there was no disc herniation, central stenosis or foraminal stenosis, impression was "unremarkable MRI of the cervical spine" by review of report appears normal.  - She went to [facility], ask that they perform dry needling for her myofascial pain syndrome, will contact - Methocarbamol three times a day during the daytime hours - muscle relaxer - maxalt for migraine headaches: Please take one tablet at the onset of your headache. If it does not improve the symptoms please take one additional tablet. Do not take more then 2 tablets in 24hrs. Do not take use more then 2 to 3 times in a week. - MRI of the brain w/wo contrast: MRI brain due to concerning symptoms of morning headaches, positional headaches,vision changes  to look for space occupying mass, chiari or intracranial hypertension (pseudotumor). - Gabapentin up to 3x a day (do not take with the methocarbamol can cause sedation) which is a good medication for pain as well as migraines and headaches.      Orders Placed This Encounter  Procedures  . MR BRAIN W WO CONTRAST  . Ambulatory referral to Physical Therapy   Meds ordered this encounter  Medications  . methocarbamol (ROBAXIN) 500 MG tablet    Sig: Take 1 tablet (500 mg total) by mouth 3 (three) times daily.    Dispense:  90 tablet    Refill:  3  . rizatriptan (MAXALT-MLT) 10 MG disintegrating tablet    Sig: Take 1 tablet (10 mg total) by mouth as needed for migraine. May repeat in 2 hours if needed    Dispense:  9 tablet    Refill:  11  . gabapentin (NEURONTIN) 300 MG capsule    Sig: Take 1 capsule (300 mg total) by mouth 3 (three) times daily.    Dispense:  90 capsule    Refill:   11   Discussed: To prevent or relieve headaches, try the following: Cool Compress. Lie down and place a cool compress on your head.  Avoid headache triggers. If certain foods or odors seem to have triggered your migraines in the past, avoid them. A headache diary might help you identify triggers.  Include physical activity in your daily routine. Try a daily walk or other moderate aerobic exercise.  Manage stress. Find healthy ways to cope with the stressors, such as delegating tasks on your to-do list.  Practice relaxation techniques. Try deep breathing, yoga, massage and visualization.  Eat regularly. Eating regularly scheduled meals and maintaining a healthy diet might help prevent headaches. Also, drink plenty of fluids.  Follow a regular sleep schedule. Sleep deprivation might contribute to headaches Consider biofeedback. With this mind-body technique, you learn to control certain bodily functions -- such as muscle tension, heart rate and blood pressure -- to prevent headaches or reduce headache pain.    Proceed to emergency room if you experience new or worsening symptoms or symptoms do not resolve, if you have new neurologic symptoms or if headache is severe, or for any concerning symptom.   Provided education and documentation from American headache Society toolbox including articles on: chronic migraine medication overuse headache, chronic migraines, prevention of migraines, behavioral and other nonpharmacologic treatments for headache.  Cc: [facility]  Iona Hansen, NP  Naomie Dean, MD  [facility] [phone] [place], [place] [address]  Phone [phone] Fax [phone]

## 2021-01-07 ENCOUNTER — Ambulatory Visit: Payer: Medicaid Other | Admitting: Neurology

## 2021-01-07 ENCOUNTER — Encounter: Payer: Self-pay | Admitting: Neurology

## 2021-01-07 VITALS — BP 134/83 | HR 81 | Ht 64.0 in | Wt 329.0 lb

## 2021-01-07 DIAGNOSIS — G43009 Migraine without aura, not intractable, without status migrainosus: Secondary | ICD-10-CM | POA: Diagnosis not present

## 2021-01-07 DIAGNOSIS — R51 Headache with orthostatic component, not elsewhere classified: Secondary | ICD-10-CM | POA: Diagnosis not present

## 2021-01-07 DIAGNOSIS — R519 Headache, unspecified: Secondary | ICD-10-CM

## 2021-01-07 DIAGNOSIS — H538 Other visual disturbances: Secondary | ICD-10-CM

## 2021-01-07 DIAGNOSIS — M7918 Myalgia, other site: Secondary | ICD-10-CM

## 2021-01-07 MED ORDER — GABAPENTIN 300 MG PO CAPS: 300.0000 mg | ORAL_CAPSULE | Freq: Three times a day (TID) | ORAL | 11 refills | Status: DC

## 2021-01-07 MED ORDER — RIZATRIPTAN BENZOATE 10 MG PO TBDP: 10.0000 mg | ORAL_TABLET | ORAL | 11 refills | Status: DC | PRN

## 2021-01-07 MED ORDER — METHOCARBAMOL 500 MG PO TABS: 500.0000 mg | ORAL_TABLET | Freq: Three times a day (TID) | ORAL | 3 refills | Status: DC

## 2021-01-07 NOTE — Patient Instructions (Addendum)
- She went to [facility] for [employer], ask that they perform dry needling for her myofascial pain syndrome, will contact - Methocarbamol three times a day during the daytime hours - muscle relaxer - maxalt for migraine headaches: Please take one tablet at the onset of your headache. If it does not improve the symptoms please take one additional tablet. Do not take more then 2 tablets in 24hrs. Do not take use more then 2 to 3 times in a week. - MRI of the brain w/wo contrast: MRI brain due to concerning symptoms of morning headaches, positional headaches,vision changes  to look for space occupying mass, chiari or intracranial hypertension (pseudotumor). - Gabapentin up to 3x a day (do not take with the methocarbamol can cause sedation) which is a good medication for pain as well as migraines and headaches.     Muscle Pain, Adult Muscle pain, also called myalgia, is a condition in which a person has pain in one or more muscles in the body. Muscle pain may be mild, moderate, or severe. It may feel sharp, achy, or burning. In most cases, the pain lasts only a short time and goes away without treatment. Muscle pain can result from using muscles in a new or different way or after a period of inactivity. It is normal to feel some muscle pain after starting an exercise program. Muscles that have not been used often will be sore at first. What are the causes? This condition is caused by using muscles in a new or different way after a period of inactivity. Other causes may include:  Overuse or muscle strain, especially if you are not in shape. This is the most common cause of muscle pain.  Injury or bruising.  Infectious diseases, including diseases caused by viruses, such as the flu (influenza).  Fibromyalgia.This is a long-term, or chronic, condition that causes muscle tenderness, tiredness (fatigue), and headache.  Autoimmune or rheumatologic diseases. These are conditions, such as lupus, in  which the body's defense system (immunesystem) attacks areas in the body.  Certain medicines, including ACE inhibitors and statins. What are the signs or symptoms? The main symptom of this condition is sore or painful muscles, including during activity and when stretching. You may also have slight swelling. How is this diagnosed? This condition is diagnosed with a physical exam. Your health care provider will ask questions about your pain and when it began. If you have not had muscle pain for very long, your health care provider may want to wait before doing much testing. If your muscle pain has lasted a long time, tests may be done right away. In some cases, this may include tests to rule out certain conditions or illnesses. How is this treated? Treatment for this condition depends on the cause. Home care is often enough to relieve muscle pain. Your health care provider may also prescribe NSAIDs, such as ibuprofen. Follow these instructions at home: Medicines  Take over-the-counter and prescription medicines only as told by your health care provider.  Ask your health care provider if the medicine prescribed to you requires you to avoid driving or using machinery. Managing pain, swelling, and discomfort  If directed, put ice on the painful area. To do this: ? Put ice in a plastic bag. ? Place a towel between your skin and the bag. ? Leave the ice on for 20 minutes, 2-3 times a day.  For the first 2 days of muscle soreness, or if there is swelling: ? Do  not soak in hot baths. ? Do not use a hot tub, steam room, sauna, heating pad, or other heat source.  After 48-72 hours, you may alternate between applying ice and applying heat as told by your health care provider. If directed, apply heat to the affected area as often as told by your health care provider. Use the heat source that your health care provider recommends, such as a moist heat pack or a heating pad. ? Place a towel between your  skin and the heat source. ? Leave the heat on for 20-30 minutes. ? Remove the heat if your skin turns bright red. This is especially important if you are unable to feel pain, heat, or cold. You may have a greater risk of getting burned.  If you have an injury, raise (elevate) the injured area above the level of your heart while you are sitting or lying down.      Activity  If overuse is causing your muscle pain: ? Slow down your activities until the pain goes away. ? Do regular, gentle exercises if you are not usually active. ? Warm up before exercising. Stretch before and after exercising. This can help lower the risk of muscle pain.  Do not continue working out if the pain is severe. Severe pain could mean that you have injured a muscle.  Do not lift anything that is heavier than 5-10 lb (2.3-4.5 kg), or the limit that you are told, until your health care provider says that it is safe.  Return to your normal activities as told by your health care provider. Ask your health care provider what activities are safe for you.   General instructions  Do not use any products that contain nicotine or tobacco, such as cigarettes, e-cigarettes, and chewing tobacco. These can delay healing. If you need help quitting, ask your health care provider.  Keep all follow-up visits as told by your health care provider. This is important. Contact a health care provider if you have:  Muscle pain that gets worse and medicines do not help.  Muscle pain that lasts longer than 3 days.  A rash or fever along with muscle pain.  Muscle pain after a tick bite.  Muscle pain while working out, even though you are in good physical condition.  Redness, soreness, or swelling along with muscle pain.  Muscle pain after starting a new medicine or changing the dose of a medicine. Get help right away if you have:  Trouble breathing.  Trouble swallowing.  Muscle pain along with a stiff neck, fever, and  vomiting.  Severe muscle weakness or you cannot move part of your body. These symptoms may represent a serious problem that is an emergency. Do not wait to see if the symptoms will go away. Get medical help right away. Call your local emergency services (911 in the [place] Do not drive yourself to the hospital. Summary  Muscle pain usually lasts only a short time and goes away without treatment.  This condition is caused by using muscles in a new or different way after a period of inactivity.  If your muscle pain lasts longer than 3 days, tell your health care provider. This information is not intended to replace advice given to you by your health care provider. Make sure you discuss any questions you have with your health care provider. Document Revised: 05/24/2019 Document Reviewed: 05/24/2019 Elsevier Patient Education  2021 Elsevier Inc.  Migraine Headache A migraine headache is an intense, throbbing pain on  one side or both sides of the head. Migraine headaches may also cause other symptoms, such as nausea, vomiting, and sensitivity to light and noise. A migraine headache can last from 4 hours to 3 days. Talk with your doctor about what things may bring on (trigger) your migraine headaches. What are the causes? The exact cause of this condition is not known. However, a migraine may be caused when nerves in the brain become irritated and release chemicals that cause inflammation of blood vessels. This inflammation causes pain. This condition may be triggered or caused by:  Drinking alcohol.  Smoking.  Taking medicines, such as: ? Medicine used to treat chest pain (nitroglycerin). ? Birth control pills. ? Estrogen. ? Certain blood pressure medicines.  Eating or drinking products that contain nitrates, glutamate, aspartame, or tyramine. Aged cheeses, chocolate, or caffeine may also be triggers.  Doing physical activity. Other things that may trigger a migraine headache  include:  Menstruation.  Pregnancy.  Hunger.  Stress.  Lack of sleep or too much sleep.  Weather changes.  Fatigue. What increases the risk? The following factors may make you more likely to experience migraine headaches:  Being a certain age. This condition is more common in people who are 48-73 years old.  Being female.  Having a family history of migraine headaches.  Being Caucasian.  Having a mental health condition, such as depression or anxiety.  Being obese. What are the signs or symptoms? The main symptom of this condition is pulsating or throbbing pain. This pain may:  Happen in any area of the head, such as on one side or both sides.  Interfere with daily activities.  Get worse with physical activity.  Get worse with exposure to bright lights or loud noises. Other symptoms may include:  Nausea.  Vomiting.  Dizziness.  General sensitivity to bright lights, loud noises, or smells. Before you get a migraine headache, you may get warning signs (an aura). An aura may include:  Seeing flashing lights or having blind spots.  Seeing bright spots, halos, or zigzag lines.  Having tunnel vision or blurred vision.  Having numbness or a tingling feeling.  Having trouble talking.  Having muscle weakness. Some people have symptoms after a migraine headache (postdromal phase), such as:  Feeling tired.  Difficulty concentrating. How is this diagnosed? A migraine headache can be diagnosed based on:  Your symptoms.  A physical exam.  Tests, such as: ? CT scan or an MRI of the head. These imaging tests can help rule out other causes of headaches. ? Taking fluid from the spine (lumbar puncture) and analyzing it (cerebrospinal fluid analysis, or CSF analysis). How is this treated? This condition may be treated with medicines that:  Relieve pain.  Relieve nausea.  Prevent migraine headaches. Treatment for this condition may also  include:  Acupuncture.  Lifestyle changes like avoiding foods that trigger migraine headaches.  Biofeedback.  Cognitive behavioral therapy. Follow these instructions at home: Medicines  Take over-the-counter and prescription medicines only as told by your health care provider.  Ask your health care provider if the medicine prescribed to you: ? Requires you to avoid driving or using heavy machinery. ? Can cause constipation. You may need to take these actions to prevent or treat constipation:  Drink enough fluid to keep your urine pale yellow.  Take over-the-counter or prescription medicines.  Eat foods that are high in fiber, such as beans, whole grains, and fresh fruits and vegetables.  Limit foods that are high  in fat and processed sugars, such as fried or sweet foods. Lifestyle  Do not drink alcohol.  Do not use any products that contain nicotine or tobacco, such as cigarettes, e-cigarettes, and chewing tobacco. If you need help quitting, ask your health care provider.  Get at least 8 hours of sleep every night.  Find ways to manage stress, such as meditation, deep breathing, or yoga. General instructions  Keep a journal to find out what may trigger your migraine headaches. For example, write down: ? What you eat and drink. ? How much sleep you get. ? Any change to your diet or medicines.  If you have a migraine headache: ? Avoid things that make your symptoms worse, such as bright lights. ? It may help to lie down in a dark, quiet room. ? Do not drive or use heavy machinery. ? Ask your health care provider what activities are safe for you while you are experiencing symptoms.  Keep all follow-up visits as told by your health care provider. This is important.      Contact a health care provider if:  You develop symptoms that are different or more severe than your usual migraine headache symptoms.  You have more than 15 headache days in one month. Get help  right away if:  Your migraine headache becomes severe.  Your migraine headache lasts longer than 72 hours.  You have a fever.  You have a stiff neck.  You have vision loss.  Your muscles feel weak or like you cannot control them.  You start to lose your balance often.  You have trouble walking.  You faint.  You have a seizure. Summary  A migraine headache is an intense, throbbing pain on one side or both sides of the head. Migraines may also cause other symptoms, such as nausea, vomiting, and sensitivity to light and noise.  This condition may be treated with medicines and lifestyle changes. You may also need to avoid certain things that trigger a migraine headache.  Keep a journal to find out what may trigger your migraine headaches.  Contact your health care provider if you have more than 15 headache days in a month or you develop symptoms that are different or more severe than your usual migraine headache symptoms. This information is not intended to replace advice given to you by your health care provider. Make sure you discuss any questions you have with your health care provider. Document Revised: 12/07/2018 Document Reviewed: 09/27/2018 Elsevier Patient Education  2021 Elsevier Inc.  Gabapentin capsules or tablets What is this medicine? GABAPENTIN (GA ba pen tin) is used to control seizures in certain types of epilepsy. It is also used to treat certain types of nerve pain. This medicine may be used for other purposes; ask your health care provider or pharmacist if you have questions. COMMON BRAND NAME(S): Active-PAC with Gabapentin, Ascencion Dike, Gralise, Neurontin What should I tell my health care provider before I take this medicine? They need to know if you have any of these conditions:  history of drug abuse or alcohol abuse problem  kidney disease  lung or breathing disease  suicidal thoughts, plans, or attempt; a previous suicide attempt by you or a family  member  an unusual or allergic reaction to gabapentin, other medicines, foods, dyes, or preservatives  pregnant or trying to get pregnant  breast-feeding How should I use this medicine? Take this medicine by mouth with a glass of water. Follow the directions on the prescription label. You  can take it with or without food. If it upsets your stomach, take it with food. Take your medicine at regular intervals. Do not take it more often than directed. Do not stop taking except on your doctor's advice. If you are directed to break the 600 or 800 mg tablets in half as part of your dose, the extra half tablet should be used for the next dose. If you have not used the extra half tablet within 28 days, it should be thrown away. A special MedGuide will be given to you by the pharmacist with each prescription and refill. Be sure to read this information carefully each time. Talk to your pediatrician regarding the use of this medicine in children. While this drug may be prescribed for children as young as 3 years for selected conditions, precautions do apply. Overdosage: If you think you have taken too much of this medicine contact a poison control center or emergency room at once. NOTE: This medicine is only for you. Do not share this medicine with others. What if I miss a dose? If you miss a dose, take it as soon as you can. If it is almost time for your next dose, take only that dose. Do not take double or extra doses. What may interact with this medicine? This medicine may interact with the following medications:  alcohol  antihistamines for allergy, cough, and cold  certain medicines for anxiety or sleep  certain medicines for depression like amitriptyline, fluoxetine, sertraline  certain medicines for seizures like phenobarbital, primidone  certain medicines for stomach problems  general anesthetics like halothane, isoflurane, methoxyflurane, propofol  local anesthetics like lidocaine,  pramoxine, tetracaine  medicines that relax muscles for surgery  narcotic medicines for pain  phenothiazines like chlorpromazine, mesoridazine, prochlorperazine, thioridazine This list may not describe all possible interactions. Give your health care provider a list of all the medicines, herbs, non-prescription drugs, or dietary supplements you use. Also tell them if you smoke, drink alcohol, or use illegal drugs. Some items may interact with your medicine. What should I watch for while using this medicine? Visit your doctor or health care provider for regular checks on your progress. You may want to keep a record at home of how you feel your condition is responding to treatment. You may want to share this information with your doctor or health care provider at each visit. You should contact your doctor or health care provider if your seizures get worse or if you have any new types of seizures. Do not stop taking this medicine or any of your seizure medicines unless instructed by your doctor or health care provider. Stopping your medicine suddenly can increase your seizures or their severity. This medicine may cause serious skin reactions. They can happen weeks to months after starting the medicine. Contact your health care provider right away if you notice fevers or flu-like symptoms with a rash. The rash may be red or purple and then turn into blisters or peeling of the skin. Or, you might notice a red rash with swelling of the face, lips or lymph nodes in your neck or under your arms. Wear a medical identification bracelet or chain if you are taking this medicine for seizures, and carry a card that lists all your medications. You may get drowsy, dizzy, or have blurred vision. Do not drive, use machinery, or do anything that needs mental alertness until you know how this medicine affects you. To reduce dizzy or fainting spells, do not sit or  stand up quickly, especially if you are an older patient.  Alcohol can increase drowsiness and dizziness. Avoid alcoholic drinks. Your mouth may get dry. Chewing sugarless gum or sucking hard candy, and drinking plenty of water will help. The use of this medicine may increase the chance of suicidal thoughts or actions. Pay special attention to how you are responding while on this medicine. Any worsening of mood, or thoughts of suicide or dying should be reported to your health care provider right away. Women who become pregnant while using this medicine may enroll in the [place] Antiepileptic Drug Pregnancy Registry by calling [phone]. This registry collects information about the safety of antiepileptic drug use during pregnancy. What side effects may I notice from receiving this medicine? Side effects that you should report to your doctor or health care professional as soon as possible:  allergic reactions like skin rash, itching or hives, swelling of the face, lips, or tongue  breathing problems  rash, fever, and swollen lymph nodes  redness, blistering, peeling or loosening of the skin, including inside the mouth  suicidal thoughts, mood changes Side effects that usually do not require medical attention (report to your doctor or health care professional if they continue or are bothersome):  dizziness  drowsiness  headache  nausea, vomiting  swelling of ankles, feet, hands  tiredness This list may not describe all possible side effects. Call your doctor for medical advice about side effects. You may report side effects to FDA at 1-800-FDA-1088. Where should I keep my medicine? Keep out of reach of children. This medicine may cause accidental overdose and death if it taken by other adults, children, or pets. Mix any unused medicine with a substance like cat litter or coffee grounds. Then throw the medicine away in a sealed container like a sealed bag or a coffee can with a lid. Do not use the medicine after the expiration  date. Store at room temperature between 15 and 30 degrees C (59 and 86 degrees F). NOTE: This sheet is a summary. It may not cover all possible information. If you have questions about this medicine, talk to your doctor, pharmacist, or health care provider.  2021 Elsevier/Gold Standard (2018-11-16 14:16:43) Rizatriptan disintegrating tablets What is this medicine? RIZATRIPTAN (rye za TRIP tan) is used to treat migraines with or without aura. An aura is a strange feeling or visual disturbance that warns you of an attack. It is not used to prevent migraines. This medicine may be used for other purposes; ask your health care provider or pharmacist if you have questions. COMMON BRAND NAME(S): Maxalt-MLT What should I tell my health care provider before I take this medicine? They need to know if you have any of these conditions:  cigarette smoker  circulation problems in fingers and toes  diabetes  heart disease  high blood pressure  high cholesterol  history of irregular heartbeat  history of stroke  kidney disease  liver disease  stomach or intestine problems  an unusual or allergic reaction to rizatriptan, other medicines, foods, dyes, or preservatives  pregnant or trying to get pregnant  breast-feeding How should I use this medicine? Take this medicine by mouth. Follow the directions on the prescription label. Leave the tablet in the sealed blister pack until you are ready to take it. With dry hands, open the blister and gently remove the tablet. If the tablet breaks or crumbles, throw it away and take a new tablet out of the blister pack. Place the tablet  in the mouth and allow it to dissolve, and then swallow. Do not cut, crush, or chew this medicine. You do not need water to take this medicine. Do not take it more often than directed. Talk to your pediatrician regarding the use of this medicine in children. While this drug may be prescribed for children as young as 6 years  for selected conditions, precautions do apply. Overdosage: If you think you have taken too much of this medicine contact a poison control center or emergency room at once. NOTE: This medicine is only for you. Do not share this medicine with others. What if I miss a dose? This does not apply. This medicine is not for regular use. What may interact with this medicine? Do not take this medicine with any of the following medicines:  certain medicines for migraine headache like almotriptan, eletriptan, frovatriptan, naratriptan, rizatriptan, sumatriptan, zolmitriptan  ergot alkaloids like dihydroergotamine, ergonovine, ergotamine, methylergonovine  MAOIs like Carbex, Eldepryl, Marplan, Nardil, and Parnate This medicine may also interact with the following medications:  certain medicines for depression, anxiety, or psychotic disorders  propranolol This list may not describe all possible interactions. Give your health care provider a list of all the medicines, herbs, non-prescription drugs, or dietary supplements you use. Also tell them if you smoke, drink alcohol, or use illegal drugs. Some items may interact with your medicine. What should I watch for while using this medicine? Visit your healthcare professional for regular checks on your progress. Tell your healthcare professional if your symptoms do not start to get better or if they get worse. You may get drowsy or dizzy. Do not drive, use machinery, or do anything that needs mental alertness until you know how this medicine affects you. Do not stand up or sit up quickly, especially if you are an older patient. This reduces the risk of dizzy or fainting spells. Alcohol may interfere with the effect of this medicine. Your mouth may get dry. Chewing sugarless gum or sucking hard candy and drinking plenty of water may help. Contact your healthcare professional if the problem does not go away or is severe. If you take migraine medicines for 10 or  more days a month, your migraines may get worse. Keep a diary of headache days and medicine use. Contact your healthcare professional if your migraine attacks occur more frequently. What side effects may I notice from receiving this medicine? Side effects that you should report to your doctor or health care professional as soon as possible:  allergic reactions like skin rash, itching or hives, swelling of the face, lips, or tongue  chest pain or chest tightness  signs and symptoms of a dangerous change in heartbeat or heart rhythm like chest pain; dizziness; fast, irregular heartbeat; palpitations; feeling faint or lightheaded; falls; breathing problems  signs and symptoms of a stroke like changes in vision; confusion; trouble speaking or understanding; severe headaches; sudden numbness or weakness of the face, arm or leg; trouble walking; dizziness; loss of balance or coordination  signs and symptoms of serotonin syndrome like irritable; confusion; diarrhea; fast or irregular heartbeat; muscle twitching; stiff muscles; trouble walking; sweating; high fever; seizures; chills; vomiting Side effects that usually do not require medical attention (report to your doctor or health care professional if they continue or are bothersome):  diarrhea  dizziness  drowsiness  dry mouth  headache  nausea, vomiting  pain, tingling, numbness in the hands or feet  stomach pain This list may not describe all possible side effects.  Call your doctor for medical advice about side effects. You may report side effects to FDA at 1-800-FDA-1088. Where should I keep my medicine? Keep out of the reach of children. Store at room temperature between 15 and 30 degrees C (59 and 86 degrees F). Protect from light and moisture. Throw away any unused medicine after the expiration date. NOTE: This sheet is a summary. It may not cover all possible information. If you have questions about this medicine, talk to your  doctor, pharmacist, or health care provider.  2021 Elsevier/Gold Standard (2018-02-27 14:58:08) Methocarbamol tablets What is this medicine? METHOCARBAMOL (meth oh KAR ba mole) helps to relieve pain and stiffness in muscles caused by strains, sprains, or other injury to your muscles. This medicine may be used for other purposes; ask your health care provider or pharmacist if you have questions. COMMON BRAND NAME(S): Robaxin What should I tell my health care provider before I take this medicine? They need to know if you have any of these conditions:  kidney disease  seizures  an unusual or allergic reaction to methocarbamol, other medicines, foods, dyes, or preservatives  pregnant or trying to get pregnant  breast-feeding How should I use this medicine? Take this medicine by mouth with a full glass of water. Follow the directions on the prescription label. Take your medicine at regular intervals. Do not take your medicine more often than directed. Talk to your pediatrician regarding the use of this medicine in children. Special care may be needed. Overdosage: If you think you have taken too much of this medicine contact a poison control center or emergency room at once. NOTE: This medicine is only for you. Do not share this medicine with others. What if I miss a dose? If you miss a dose, take it as soon as you can. If it is almost time for your next dose, take only the next dose. Do not take double or extra doses. What may interact with this medicine? Do not take this medication with any of the following medicines:  narcotic medicines for cough This medicine may also interact with the following medications:  alcohol  antihistamines for allergy, cough and cold  certain medicines for anxiety or sleep  certain medicines for depression like amitriptyline, fluoxetine, sertraline  certain medicines for seizures like phenobarbital, primidone  cholinesterase inhibitors like  neostigmine, ambenonium, and pyridostigmine bromide  general anesthetics like halothane, isoflurane, methoxyflurane, propofol  local anesthetics like lidocaine, pramoxine, tetracaine  medicines that relax muscles for surgery  narcotic medicines for pain  phenothiazines like chlorpromazine, mesoridazine, prochlorperazine, thioridazine This list may not describe all possible interactions. Give your health care provider a list of all the medicines, herbs, non-prescription drugs, or dietary supplements you use. Also tell them if you smoke, drink alcohol, or use illegal drugs. Some items may interact with your medicine. What should I watch for while using this medicine? Tell your doctor or health care professional if your symptoms do not start to get better or if they get worse. You may get drowsy or dizzy. Do not drive, use machinery, or do anything that needs mental alertness until you know how this medicine affects you. Do not stand or sit up quickly, especially if you are an older patient. This reduces the risk of dizzy or fainting spells. Alcohol may interfere with the effect of this medicine. Avoid alcoholic drinks. If you are taking another medicine that also causes drowsiness, you may have more side effects. Give your health care provider a list  of all medicines you use. Your doctor will tell you how much medicine to take. Do not take more medicine than directed. Call emergency for help if you have problems breathing or unusual sleepiness. What side effects may I notice from receiving this medicine? Side effects that you should report to your doctor or health care professional as soon as possible:  allergic reactions like skin rash, itching or hives, swelling of the face, lips, or tongue  breathing problems  confusion  seizures  unusually weak or tired Side effects that usually do not require medical attention (report to your doctor or health care professional if they continue or are  bothersome):  dizziness  headache  metallic taste  tiredness  upset stomach This list may not describe all possible side effects. Call your doctor for medical advice about side effects. You may report side effects to FDA at 1-800-FDA-1088. Where should I keep my medicine? Keep out of the reach of children. Store at room temperature between 20 and 25 degrees C (68 and 77 degrees F). Keep container tightly closed. Throw away any unused medicine after the expiration date. NOTE: This sheet is a summary. It may not cover all possible information. If you have questions about this medicine, talk to your doctor, pharmacist, or health care provider.  2021 Elsevier/Gold Standard (2015-05-26 13:11:54)

## 2021-01-18 ENCOUNTER — Other Ambulatory Visit: Payer: Self-pay

## 2021-01-18 ENCOUNTER — Ambulatory Visit: Payer: Medicaid Other | Attending: Neurology | Admitting: Physical Therapy

## 2021-01-18 ENCOUNTER — Encounter: Payer: Self-pay | Admitting: Physical Therapy

## 2021-01-18 DIAGNOSIS — R519 Headache, unspecified: Secondary | ICD-10-CM | POA: Insufficient documentation

## 2021-01-18 DIAGNOSIS — M542 Cervicalgia: Secondary | ICD-10-CM | POA: Diagnosis not present

## 2021-01-18 DIAGNOSIS — G8929 Other chronic pain: Secondary | ICD-10-CM | POA: Insufficient documentation

## 2021-01-18 DIAGNOSIS — R252 Cramp and spasm: Secondary | ICD-10-CM | POA: Insufficient documentation
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
## 2021-01-18 NOTE — Patient Instructions (Signed)

## 2021-02-01 ENCOUNTER — Ambulatory Visit: Payer: Medicaid Other | Attending: Neurology | Admitting: Rehabilitative and Restorative Service Providers"

## 2021-02-01 DIAGNOSIS — G8929 Other chronic pain: Secondary | ICD-10-CM | POA: Insufficient documentation

## 2021-02-01 DIAGNOSIS — M6281 Muscle weakness (generalized): Secondary | ICD-10-CM | POA: Insufficient documentation

## 2021-02-01 DIAGNOSIS — R519 Headache, unspecified: Secondary | ICD-10-CM | POA: Insufficient documentation

## 2021-02-01 DIAGNOSIS — M542 Cervicalgia: Secondary | ICD-10-CM | POA: Insufficient documentation

## 2021-02-01 DIAGNOSIS — R252 Cramp and spasm: Secondary | ICD-10-CM | POA: Insufficient documentation

## 2021-02-08 ENCOUNTER — Ambulatory Visit: Payer: Medicaid Other | Admitting: Rehabilitative and Restorative Service Providers"

## 2021-02-08 ENCOUNTER — Other Ambulatory Visit: Payer: Self-pay

## 2021-02-08 ENCOUNTER — Encounter: Payer: Self-pay | Admitting: Rehabilitative and Restorative Service Providers"

## 2021-02-08 DIAGNOSIS — G8929 Other chronic pain: Secondary | ICD-10-CM

## 2021-02-08 DIAGNOSIS — R519 Headache, unspecified: Secondary | ICD-10-CM | POA: Diagnosis not present

## 2021-02-08 DIAGNOSIS — M6281 Muscle weakness (generalized): Secondary | ICD-10-CM | POA: Diagnosis not present

## 2021-02-08 DIAGNOSIS — R252 Cramp and spasm: Secondary | ICD-10-CM

## 2021-02-08 DIAGNOSIS — M542 Cervicalgia: Secondary | ICD-10-CM

## 2021-02-08 NOTE — Therapy (Signed)
[facility] [address] [place], [place], [address] Phone: [phone]   Fax:  [phone]  Physical Therapy Treatment  Patient Details  Name: Samantha Morse MRN: [id number] Date of Birth: [birth year] Referring Provider (PT): Lucia Gaskins   Encounter Date: 02/08/2021   PT End of Session - 02/08/21 1202     Visit Number 2    Date for PT Re-Evaluation 03/20/21    Authorization Type [employer], does not cover estim or traction    PT Start Time 1155    PT Stop Time 1240    PT Time Calculation (min) 45 min    Activity Tolerance Patient tolerated treatment well    Behavior During Therapy [facility] for tasks assessed/performed             Past Medical History:  Diagnosis Date   Asthma    prn inhaler   Complication of anesthesia 04/27/2015   vomiting and laryngospasm on extubation, requiring CPAP, jaw thrust and succinylcholine   Hidradenitis axillaris 07/2015   right   Irregular periods    Hx - now on OCP daily   Obesity    Pollen allergy    PONV (postoperative nausea and vomiting)     Past Surgical History:  Procedure Laterality Date   HYDRADENITIS EXCISION Left 04/27/2015   Procedure: EXCISION HIDRADENITIS LEFT AXILLA [name] CLOSURE ;  Surgeon: Louisa Second, MD;  Location: [facility];  Service: Plastics;  Laterality: Left;   HYDRADENITIS EXCISION Right 08/10/2015   Procedure: EXCISION HIDRADENITIS RIGHT AXILLA, RYAN POLLOCK CLOSURE;  Surgeon: Louisa Second, MD;  Location: [facility];  Service: Plastics;  Laterality: Right;   TONSILLECTOMY Bilateral 03/15/2019   Procedure: TONSILLECTOMY;  Surgeon: Graylin Shiver, MD;  Location: [facility] OR;  Service: ENT;  Laterality: Bilateral;    There were no vitals filed for this visit.   Subjective Assessment - 02/08/21 1201     Subjective I am feeling really tense today.  I go get my MRI on Wednesday, I have been having a lot of headaches.     Patient Stated Goals get rid of pain in neck    Currently in Pain? Yes    Pain Score 7     Pain Location Neck    Pain Orientation Right    Pain Descriptors / Indicators Tightness;Spasm                               [facility] Adult PT Treatment/Exercise - 02/08/21 0001       Neck Exercises: Machines for Strengthening   UBE (Upper Arm Bike) L 1.5 x3 min each    Cybex Row 20# 2x10    Lat Pull 20# 2x10      Neck Exercises: Theraband   Shoulder Extension 20 reps    Shoulder Extension Limitations Power Tower 15# bilat 2x10      Neck Exercises: Standing   Wall Push Ups 20 reps    Upper Extremity D2 Extension;10 reps;Theraband    Theraband Level (UE D2) Level 2 (Red)    UE D2 Limitations bilat    Other Standing Exercises scap stab 3 ways with red TB x5 B      Neck Exercises: Seated   Neck Retraction 20 reps;3 secs      Modalities   Modalities Moist Heat      Moist Heat Therapy   Number Minutes Moist Heat 10 Minutes  Moist Heat Location Cervical      Manual Therapy   Manual Therapy Soft tissue mobilization    Soft tissue mobilization right upper trap      Neck Exercises: Stretches   Corner Stretch 3 reps;10 seconds              Trigger Point Dry Needling - 02/08/21 0001     Consent Given? Yes    Muscles Treated Head and Neck Upper trapezius    Upper Trapezius Response Twitch reponse elicited                    PT Short Term Goals - 02/08/21 1240       PT SHORT TERM GOAL #1   Title Pt will be I with initial HEP    Status On-going               PT Long Term Goals - 02/08/21 1242       PT LONG TERM GOAL #1   Title Pt will be I with advanced HEP    Status On-going      PT LONG TERM GOAL #2   Title Pt will report resolution of RUE radiating pain    Status On-going      PT LONG TERM GOAL #3   Title Pt will report 50% reduction in cervical pain    Status On-going      PT LONG TERM GOAL #4   Title Pt will understand  posture and body mechanics    Status On-going      PT LONG TERM GOAL #5   Title decrease HA frequency 50%    Status On-going                   Plan - 02/08/21 1235     Clinical Impression Statement Patient arrived 9 min late for her scheduled appointment.  She states that she is now working as a Production designer, theatre/television/film at [employer] and is feeling more tension than before. Given she had some relief with DN last session, performed again to alleviate pain and tension at trigger points.  She was able to complete session and add in additional strengthening.  She continues to progress towards goal related activities.    PT Treatment/Interventions ADLs/Self Care Home Management;Moist Heat;Therapeutic activities;Therapeutic exercise;Neuromuscular re-education;Manual techniques;Patient/family education;Passive range of motion;Dry needling;Taping    PT Next Visit Plan see if DN helped, add exercises, could try manual STM and occipital release    Consulted and Agree with Plan of Care Patient             Patient will benefit from skilled therapeutic intervention in order to improve the following deficits and impairments:  Decreased range of motion, Increased muscle spasms, Impaired UE functional use, Decreased activity tolerance, Pain, Impaired flexibility, Postural dysfunction, Improper body mechanics, Dizziness  Visit Diagnosis: Cervicalgia  Cramp and spasm  Chronic nonintractable headache, unspecified headache type  Neck pain  Muscle weakness (generalized)     Problem List Patient Active Problem List   Diagnosis Date Noted   Migraine without aura and without status migrainosus, not intractable 01/07/2021   Cervical myofascial pain syndrome 01/07/2021   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   Peritonsillar abscess 02/18/2019   Obesity 06/28/2016   Hidradenitis suppurativa 06/28/2016   Amenorrhea 06/28/2016    Reather Laurence, PT, DPT 02/08/2021, 12:46 PM  [facility] [address] [place], [place], [address] Phone: [phone]   Fax:  [phone]  Name: Samantha Morse MRN: [id number] Date of Birth: [birth year]

## 2021-02-10 ENCOUNTER — Ambulatory Visit
Admission: RE | Admit: 2021-02-10 | Discharge: 2021-02-10 | Disposition: A | Discharge status: Home / Self Care | Payer: Medicaid Other | Source: Ambulatory Visit | Attending: Neurology | Admitting: Neurology

## 2021-02-10 DIAGNOSIS — H538 Other visual disturbances: Secondary | ICD-10-CM | POA: Diagnosis not present

## 2021-02-10 DIAGNOSIS — R519 Headache, unspecified: Secondary | ICD-10-CM

## 2021-02-10 DIAGNOSIS — R51 Headache with orthostatic component, not elsewhere classified: Secondary | ICD-10-CM

## 2021-02-10 MED ORDER — GADOBENATE DIMEGLUMINE 529 MG/ML IV SOLN
20.0000 mL | Freq: Once | INTRAVENOUS | Status: AC | PRN
  Administered 2021-02-10: 20 mL via INTRAVENOUS

## 2021-02-15 ENCOUNTER — Ambulatory Visit: Payer: Medicaid Other | Admitting: Physical Therapy

## 2021-02-19 ENCOUNTER — Ambulatory Visit: Payer: Medicaid Other | Admitting: Physical Therapy

## 2021-02-19 ENCOUNTER — Other Ambulatory Visit: Payer: Self-pay

## 2021-02-19 ENCOUNTER — Encounter: Payer: Self-pay | Admitting: Physical Therapy

## 2021-02-19 DIAGNOSIS — G8929 Other chronic pain: Secondary | ICD-10-CM | POA: Diagnosis not present

## 2021-02-19 DIAGNOSIS — M542 Cervicalgia: Secondary | ICD-10-CM

## 2021-02-19 DIAGNOSIS — R252 Cramp and spasm: Secondary | ICD-10-CM

## 2021-02-19 DIAGNOSIS — M6281 Muscle weakness (generalized): Secondary | ICD-10-CM | POA: Diagnosis not present

## 2021-02-19 DIAGNOSIS — R519 Headache, unspecified: Secondary | ICD-10-CM | POA: Diagnosis not present

## 2021-02-19 NOTE — Therapy (Signed)
[place]. [place], [place], [address] Phone: [phone]   Fax:  [phone]  Physical Therapy Treatment PHYSICAL THERAPY DISCHARGE SUMMARY  Visits from Start of Care: 3  Patient agrees to discharge. Patient goals were partially met. Patient is being discharged due to being pleased with the current functional level.  Patient Details  Name: Samantha Morse MRN: [id number] Date of Birth: [birth year] Referring Provider (PT): Jaynee Eagles   Encounter Date: 02/19/2021   PT End of Session - 02/19/21 0939     Visit Number 3    Date for PT Re-Evaluation 03/20/21    PT Start Time 0853    PT Stop Time 0924    PT Time Calculation (min) 31 min    Activity Tolerance Patient tolerated treatment well    Behavior During Therapy [facility] for tasks assessed/performed             Past Medical History:  Diagnosis Date   Asthma    prn inhaler   Complication of anesthesia 04/27/2015   vomiting and laryngospasm on extubation, requiring CPAP, jaw thrust and succinylcholine   Hidradenitis axillaris 07/2015   right   Irregular periods    Hx - now on OCP daily   Obesity    Pollen allergy    PONV (postoperative nausea and vomiting)     Past Surgical History:  Procedure Laterality Date   HYDRADENITIS EXCISION Left 04/27/2015   Procedure: EXCISION HIDRADENITIS LEFT AXILLA [place] ;  Surgeon: Cristine Polio, MD;  Location: [place];  Service: Plastics;  Laterality: Left;   HYDRADENITIS EXCISION Right 08/10/2015   Procedure: EXCISION HIDRADENITIS RIGHT AXILLA, RYAN POLLOCK CLOSURE;  Surgeon: Cristine Polio, MD;  Location: [place];  Service: Plastics;  Laterality: Right;   TONSILLECTOMY Bilateral 03/15/2019   Procedure: TONSILLECTOMY;  Surgeon: Helayne Seminole, MD;  Location: [place];  Service: ENT;  Laterality: Bilateral;    There were no vitals filed for this visit.   Subjective  Assessment - 02/19/21 0855     Subjective Pt received good report from MD; MRI unremarkable. Reports no pain today. Thinks she is ready to discharge from physical therapy.    Currently in Pain? No/denies    Pain Score 0-No pain    Pain Location Neck                [facility] PT Assessment - 02/19/21 0001       Observation/Other Assessments   Focus on Therapeutic Outcomes (FOTO)  72%                           [facility] Adult PT Treatment/Exercise - 02/19/21 0001       Self-Care   Self-Care Other Self-Care Comments    Other Self-Care Comments  education on continuation of HEP and incorporation of stretching into daily activities      Neck Exercises: Machines for Strengthening   UBE (Upper Arm Bike) L2 x 3 min each    Nustep L5 x 5 min    Cybex Row 25# 2x10    Cybex Chest Press 15# 2x10 with serratus push    Lat Pull 25# 2x10      Neck Exercises: Theraband   Shoulder Extension 20 reps    Shoulder Extension Limitations Power Tower 10# bilat 2x10      Neck Exercises: Standing   Other Standing Exercises scap stab 3 ways with red  TB x5 B    Other Standing Exercises standing flex/ext/IR x10 3 sec hold                      PT Short Term Goals - 02/19/21 0944       PT SHORT TERM GOAL #1   Title Pt will be I with initial HEP    Status Achieved               PT Long Term Goals - 02/19/21 0859       PT LONG TERM GOAL #1   Title Pt will be I with advanced HEP    Status Achieved      PT LONG TERM GOAL #2   Title Pt will report resolution of RUE radiating pain    Baseline reports occastional RUE radiating pain    Status Partially Met      PT LONG TERM GOAL #3   Title Pt will report 50% reduction in cervical pain    Baseline reports 50% better    Status Achieved      PT LONG TERM GOAL #4   Title Pt will understand posture and body mechanics    Status Achieved      PT LONG TERM GOAL #5   Title decrease HA frequency 50%    Baseline  reports HA decreased 50%    Status Achieved                   Plan - 02/19/21 0940     Clinical Impression Statement Pt arrived late, so abbreviated session this rx. Pt MRI negative for any acute abnormalities. Pt requesting discharge secondary to being pleased with functional progress; states sig improvement in neck pain. No neck pain this rx. Pt reports feeling that she has the tools to continue HEP on her own. Educated on when to return if signs/symptoms worsen or recur with pt VU and agreement.    PT Treatment/Interventions ADLs/Self Care Home Management;Moist Heat;Therapeutic activities;Therapeutic exercise;Neuromuscular re-education;Manual techniques;Patient/family education;Passive range of motion;Dry needling;Taping    PT Next Visit Plan pt requesting d/c secondary to pleased with functional progress    Consulted and Agree with Plan of Care Patient             Patient will benefit from skilled therapeutic intervention in order to improve the following deficits and impairments:  Decreased range of motion, Increased muscle spasms, Impaired UE functional use, Decreased activity tolerance, Pain, Impaired flexibility, Postural dysfunction, Improper body mechanics, Dizziness  Visit Diagnosis: Cervicalgia  Cramp and spasm  Neck pain  Muscle weakness (generalized)     Problem List Patient Active Problem List   Diagnosis Date Noted   Migraine without aura and without status migrainosus, not intractable 01/07/2021   Cervical myofascial pain syndrome 01/07/2021   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   Peritonsillar abscess 02/18/2019   Obesity 06/28/2016   Hidradenitis suppurativa 06/28/2016   Amenorrhea 06/28/2016   Amador Cunas, PT, DPT Donald Prose Shalay Carder 02/19/2021, 9:45 AM  [place]. [place], [place], [address] Phone: [phone]   Fax:  [phone]  Name: Samantha Morse MRN: [id number] Date of  Birth: 11-02-2000

## 2021-03-02 ENCOUNTER — Ambulatory Visit: Payer: Medicaid Other | Admitting: Neurology

## 2021-03-02 ENCOUNTER — Encounter: Payer: Self-pay | Admitting: Neurology

## 2021-03-02 ENCOUNTER — Other Ambulatory Visit: Payer: Self-pay | Admitting: Neurology

## 2021-03-02 ENCOUNTER — Telehealth: Payer: Self-pay | Admitting: *Deleted

## 2021-03-02 NOTE — Telephone Encounter (Signed)
Patient no showed f/u appointment today. This her first no-show.

## 2021-03-02 NOTE — Progress Notes (Deleted)
[facility]    Provider:  Dr Lucia Gaskins Requesting Provider: Iona Hansen, NP, Iona Hansen, NP Primary Care Provider:  Iona Hansen, NP  CC:  Headaches  HPI:  Samantha Morse is a 20 y.o. female here as requested by Iona Hansen, NP for headaches. PMHx asthma, obesity. I reviewed Iona Hansen, NP's notes: Physical examination showed enlarged turbinates boggy and right range of motion decreased in the neck, left range of motion in the neck normal, tender right trapezius with no deformity otherwise normal including cardiovascular, pulmonary, neurologic.  She was given baclofen and tramadol for neck and shoulder pain and for her chronic non-intractable episodic headache.  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  Diagnosed with muscular strain versus spasms and referred to physical therapy.No other focal neurologic deficits, associated symptoms, inciting events or modifiable factors.  Her neck started getting tight since last year, mostly on the right, in the traps mostly on the right, very tight, once that get tight her head starts hurting. Last week she was dizzy, thought she was having a stroke, if she moved too fast she felt faint, been happening more, the headache feels like someone is squeezing her brain, the worst ones are on the right, she has to turn the lights off, sleep helps, nausea, photophobia, movement makes it worse. She has the neck pain all the time and osmetimes it trigger the headaches. She has been to physical therapy. She is a Licensed conveyancer at [employer], physical job. Neck pain is  every day. Out of a week she has 2 headaches a week. If she doesn't get herself to sleep it is at least 4 hours may - 24 hours, she often has other headaches as well. Triggers include missing meals, or dehyradration. Associated dizziness. The headaches are positional, worse bending over she may faint, her vision goes "in and out" and trouble focusing, blurs out, she has numbness of the right arm and fingers. Had an MRI of the cervical spine at [facility] (will request). She can wake up with the headaches.   Reviewed notes, labs and imaging from outside physicians, which showed:  Cbc nml, cmp unremarkable 06/2020  Medications tried in the past for headaches and neck pain: tylenol, ibuprofen, tramadol, baclofen, prednisone, reglan, flexeril, naproxen, zofran,   06/2017 XR cervical: IMPRESSION: reviewed images Anterior subluxation of C4 on C5 seen only on the swimmer's view with normal alignment of the neutral view. This suggests instability which could be due to occult fracture or ligamentous injury. CT suggested for further evaluation.  Review of Systems: Patient complains of symptoms per HPI as well as the following symptoms neck pain, headache. Pertinent negatives and positives per HPI. All others negative.   Social History   Socioeconomic History   Marital status: Significant Other    Spouse name: Not on file   Number of children: 0   Years of education: Not on file   Highest education level: Some college, no degree  Occupational History   Not on file  Tobacco Use   Smoking status: Former    Pack  years: 0.00    Types: Cigars   Smokeless tobacco: Never   Tobacco comments:    Black and Mild Cigars, 2 cigars/week  Vaping Use   Vaping Use: Never used  Substance and Sexual Activity   Alcohol use: Yes    Comment: socially   Drug use: Yes    Types: Marijuana    Comment: 01/07/21 1-2 x daily   Sexual activity: Yes    Birth control/protection: Pill  Other Topics Concern   Not  on file  Social History Narrative   Lives with sig other   Social Determinants of Health   Financial Resource Strain: Not on file  Food Insecurity: Not on file  Transportation Needs: Not on file  Physical Activity: Not on file  Stress: Not on file  Social Connections: Not on file  Intimate Partner Violence: Not on file    Family History  Problem Relation Age of Onset   Hypertension Maternal Grandmother    Kidney cancer Maternal Grandmother    Hypertension Mother    Heart disease Mother        MI   Pulmonary embolism Mother    Thyroid disease Mother    Hernia Mother    Diabetes Maternal Uncle    Diabetes Maternal Grandfather    Hypertension Maternal Grandfather    Cirrhosis Maternal Aunt     Past Medical History:  Diagnosis Date   Asthma    prn inhaler   Complication of anesthesia 04/27/2015   vomiting and laryngospasm on extubation, requiring CPAP, jaw thrust and succinylcholine   Hidradenitis axillaris 07/2015   right   Irregular periods    Hx - now on OCP daily   Obesity    Pollen allergy    PONV (postoperative nausea and vomiting)     Patient Active Problem List   Diagnosis Date Noted   Migraine without aura and without status migrainosus, not intractable 01/07/2021   Cervical myofascial pain syndrome 01/07/2021   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   Peritonsillar abscess 02/18/2019   Obesity 06/28/2016   Hidradenitis suppurativa 06/28/2016   Amenorrhea 06/28/2016    Past Surgical History:  Procedure Laterality Date   HYDRADENITIS EXCISION Left 04/27/2015   Procedure: EXCISION HIDRADENITIS LEFT AXILLA [name] CLOSURE ;  Surgeon: Louisa SecondGerald Truesdale, MD;  Location: [facility];  Service: Plastics;  Laterality: Left;   HYDRADENITIS EXCISION Right 08/10/2015   Procedure: EXCISION HIDRADENITIS RIGHT AXILLA, RYAN POLLOCK CLOSURE;  Surgeon: Louisa SecondGerald Truesdale, MD;  [facility];  Service: Plastics;  Laterality: Right;    TONSILLECTOMY Bilateral 03/15/2019   Procedure: TONSILLECTOMY;  Surgeon: Graylin ShiverMarcellino, Amanda J, MD;  [facility];  Service: ENT;  Laterality: Bilateral;    Current Outpatient Medications  Medication Sig Dispense Refill   acetaminophen (TYLENOL) 325 MG tablet Take 2 tablets (650 mg total) by mouth every 6 (six) hours.     albuterol (PROVENTIL HFA;VENTOLIN HFA) 108 (90 Base) MCG/ACT inhaler Inhale 1-2 puffs into the lungs every 6 (six) hours as needed for wheezing or shortness of breath. 1 Inhaler 0   Baclofen 5 MG TABS Take 1 tablet by mouth 3 (three) times daily as needed.     cyclobenzaprine (FLEXERIL) 10 MG tablet Take 1 tablet (10 mg total) by mouth 2 (two) times daily as needed for muscle spasms. 20 tablet 0   gabapentin (NEURONTIN) 300 MG capsule Take 1 capsule (300 mg total) by mouth 3 (three) times daily. 90 capsule 11   ibuprofen (ADVIL) 800  MG tablet Take 1 tablet (800 mg total) by mouth every 8 (eight) hours as needed for moderate pain. 21 tablet 0   loratadine (CLARITIN) 10 MG tablet Take 10 mg by mouth daily as needed for allergies.   11   methocarbamol (ROBAXIN) 500 MG tablet Take 1 tablet (500 mg total) by mouth 3 (three) times daily. 90 tablet 3   naproxen (NAPROSYN) 500 MG tablet Take 1 tablet (500 mg total) by mouth 2 (two) times daily. 30 tablet 0   Norgestimate-Ethinyl Estradiol Triphasic 0.18/0.215/0.25 MG-25 MCG tab Take 1 tablet by mouth daily.   6   rizatriptan (MAXALT-MLT) 10 MG disintegrating tablet Take 1 tablet (10 mg total) by mouth as needed for migraine. May repeat in 2 hours if needed 9 tablet 11   traMADol (ULTRAM) 50 MG tablet Take 50 mg by mouth every 8 (eight) hours as needed.     No current facility-administered medications for this visit.    Allergies as of 03/02/2021 - Review Complete 02/19/2021  Allergen Reaction Noted   Bee pollen  12/04/2014   Pollen extract Other (See Comments) 12/04/2014    Vitals: There were no vitals taken for this  visit. Last Weight:  Wt Readings from Last 1 Encounters:  01/07/21 (!) 329 lb (149.2 kg)   Last Height:   Ht Readings from Last 1 Encounters:  01/07/21 5\' 4"  (1.626 m)     Physical exam: Exam: Gen: NAD, conversant, well nourised, obese, well groomed                     CV: RRR, no MRG. No Carotid Bruits. No peripheral edema, warm, nontender Eyes: Conjunctivae clear without exudates or hemorrhage  Neuro: Detailed Neurologic Exam  Speech:    Speech is normal; fluent and spontaneous with normal comprehension.  Cognition:    The patient is oriented to person, place, and time;     recent and remote memory intact;     language fluent;     normal attention, concentration,     fund of knowledge Cranial Nerves:    The pupils are equal, round, and reactive to light. The fundi are normal and spontaneous venous pulsations are present. Visual fields are full to finger confrontation. Extraocular movements are intact. Trigeminal sensation is intact and the muscles of mastication are normal. The face is symmetric. The palate elevates in the midline. Hearing intact. Voice is normal. Shoulder shrug is normal. The tongue has normal motion without fasciculations.   Coordination:    Normal finger to nose and heel to shin. Normal rapid alternating movements.   Gait:    Heel-toe and tandem gait are normal.   Motor Observation:    No asymmetry, no atrophy, and no involuntary movements noted. Tone:    Normal muscle tone.    Posture:    Posture is normal. normal erect    Strength:    Strength is V/V in the upper and lower limbs.      Sensation: intact to LT     Reflex Exam:  DTR's:    Deep tendon reflexes in the upper and lower extremities are normal bilaterally.   Toes:    The toes are downgoing bilaterally.   Clonus:    Clonus is absent.    Assessment/Plan:  20 year old with myofascial cervical pain triggering migraines. However due to concerning symptoms needs MRI of the brain.  Had MRI of the cervical spine at [facility] will request CD and results.  Addendum Jan 11, 2021: I reviewed MRI report of the cervical spine from [facility], at every level there was no disc herniation, central stenosis or foraminal stenosis, impression was "unremarkable MRI of the cervical spine" by review of report appears normal.  - She went to [facility] for [facility] [employer], ask that they perform dry needling for her myofascial pain syndrome, will contact - Methocarbamol three times a day during the daytime hours - muscle relaxer - maxalt for migraine headaches: Please take one tablet at the onset of your headache. If it does not improve the symptoms please take one additional tablet. Do not take more then 2 tablets in 24hrs. Do not take use more then 2 to 3 times in a week. - MRI of the brain w/wo contrast: MRI brain due to concerning symptoms of morning headaches, positional headaches,vision changes  to look for space occupying mass, chiari or intracranial hypertension (pseudotumor). - Gabapentin up to 3x a day (do not take with the methocarbamol can cause sedation) which is a good medication for pain as well as migraines and headaches.      No orders of the defined types were placed in this encounter.  No orders of the defined types were placed in this encounter.  Discussed: To prevent or relieve headaches, try the following: Cool Compress. Lie down and place a cool compress on your head.  Avoid headache triggers. If certain foods or odors seem to have triggered your migraines in the past, avoid them. A headache diary might help you identify triggers.  Include physical activity in your daily routine. Try a daily walk or other moderate aerobic exercise.  Manage stress. Find healthy ways to cope with the stressors, such as delegating tasks on your to-do list.  Practice relaxation techniques. Try deep breathing, yoga, massage and visualization.  Eat regularly. Eating regularly scheduled  meals and maintaining a healthy diet might help prevent headaches. Also, drink plenty of fluids.  Follow a regular sleep schedule. Sleep deprivation might contribute to headaches Consider biofeedback. With this mind-body technique, you learn to control certain bodily functions -- such as muscle tension, heart rate and blood pressure -- to prevent headaches or reduce headache pain.    Proceed to emergency room if you experience new or worsening symptoms or symptoms do not resolve, if you have new neurologic symptoms or if headache is severe, or for any concerning symptom.   Provided education and documentation from American headache Society toolbox including articles on: chronic migraine medication overuse headache, chronic migraines, prevention of migraines, behavioral and other nonpharmacologic treatments for headache.  Cc: Iona Hansen, NP,  Iona Hansen, NP  Naomie Dean, MD  [facility] [address] [place], [place] [address]  Phone [phone] Fax [phone]

## 2021-03-15 ENCOUNTER — Encounter: Payer: Self-pay | Admitting: Neurology

## 2021-03-15 ENCOUNTER — Other Ambulatory Visit: Payer: Self-pay

## 2021-03-15 ENCOUNTER — Ambulatory Visit: Payer: Medicaid Other | Admitting: Neurology

## 2021-03-15 VITALS — BP 147/75 | HR 86 | Ht 65.0 in | Wt 333.0 lb

## 2021-03-15 DIAGNOSIS — R252 Cramp and spasm: Secondary | ICD-10-CM

## 2021-03-15 DIAGNOSIS — M7918 Myalgia, other site: Secondary | ICD-10-CM | POA: Diagnosis not present

## 2021-03-15 DIAGNOSIS — G43009 Migraine without aura, not intractable, without status migrainosus: Secondary | ICD-10-CM | POA: Diagnosis not present

## 2021-03-15 DIAGNOSIS — M6289 Other specified disorders of muscle: Secondary | ICD-10-CM

## 2021-03-15 NOTE — Progress Notes (Signed)
[facility]    Provider:  Dr Lucia Gaskins Requesting Provider: Iona Hansen, NP, Iona Hansen, NP Primary Care Provider:  Iona Hansen, NP  CC:  Headaches  Interval history 03/15/2021: Migraines and cervical myofascial muscle pain, has had MRI brain and c-spine imaging without any concerning results. She has been going to PT for dry needling and taking methocarbamol,gabapentin for the myofascial pain and maxalt prn for migraines. On review of her med list, there is also baclofen, flexeril and tramadol on there now (discussed sedation and resp suppression if combining muscle relaxants and other sedating medications). she has not had that many migraines. She has some stiffness and cramping. Her legs will cramp up. She can get some stiffness in her back. It helps to lay flat. She has something to take when she gets migraines so happy with that. They graduated her from PT. Continue methocarbamol and maxalt as needed, gabapentin as needed.   MRI brain 02/10/2021: FINDINGS: The brain parenchyma shows no abnormal signal intensities.  No structural lesion, tumor or infarct is noted.No abnormal lesions are seen on diffusion-weighted views to suggest acute ischemia. The cortical sulci, fissures and cisterns are normal in size and appearance. Lateral, third and fourth ventricle are normal in size and appearance. No extra-axial fluid collections are seen. No evidence of mass effect or midline shift.  No abnormal lesions are seen on post contrast views.  On sagittal views the posterior fossa, pituitary gland and corpus callosum are unremarkable. No evidence of intracranial hemorrhage on gradient-echo views. The orbits and their contents, paranasal sinuses and calvarium are unremarkable.  Intracranial flow voids are present.     IMPRESSION: Unremarkable MRI scan of the brain with and without contrast  HPI:  Samantha Morse is a 20 y.o. female here as requested by Iona Hansen, NP for headaches.  PMHx asthma, obesity. I reviewed Iona Hansen, NP's notes: Physical examination showed enlarged turbinates boggy and right range of motion decreased in the neck, left range of motion in the neck normal, tender right trapezius with no deformity otherwise normal including cardiovascular, pulmonary, neurologic.  She was given baclofen and tramadol for neck and shoulder pain and for her chronic non-intractable episodic headache.  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  Diagnosed with muscular strain versus spasms and referred to physical therapy.No other focal neurologic deficits, associated symptoms, inciting events or modifiable factors.  Her neck started getting tight since last year, mostly on the right, in the traps mostly on the right, very tight, once that get tight her head starts hurting. Last week she was dizzy, thought she was having a stroke, if she moved too fast she felt faint, been happening more, the headache feels like someone is squeezing her brain, the worst ones are on the right, she has to turn the lights off, sleep helps, nausea, photophobia, movement makes it worse. She has the neck pain all the time and osmetimes it trigger the headaches. She has been to physical therapy. She is a Licensed conveyancer at [employer], physical job. Neck pain is every day. Out of a  week she has 2 headaches a week. If she doesn't get herself to sleep it is at least 4 hours may - 24 hours, she often has other headaches as well. Triggers include missing meals, or dehyradration. Associated dizziness. The headaches are positional, worse bending  over she may faint, her vision goes "in and out" and trouble focusing, blurs out, she has numbness of the right arm and fingers. Had an MRI of the cervical spine at [facility] (will request). She can wake up with the headaches.   Reviewed notes, labs and imaging from outside physicians, which showed:  Cbc nml, cmp unremarkable 06/2020  Medications tried in the past for headaches and neck pain: tylenol, ibuprofen, tramadol, baclofen, prednisone, reglan, flexeril, naproxen, zofran,   06/2017 XR cervical: IMPRESSION: reviewed images Anterior subluxation of C4 on C5 seen only on the swimmer's view with normal alignment of the neutral view. This suggests instability which could be due to occult fracture or ligamentous injury. CT suggested for further evaluation.  Review of Systems: Patient complains of symptoms per HPI as well as the following symptoms: stiffness . Pertinent negatives and positives per HPI. All others negative    Social History   Socioeconomic History   Marital status: Significant Other    Spouse name: Not on file   Number of children: 0   Years of education: Not on file   Highest education level: Some college, no degree  Occupational History   Not on file  Tobacco Use   Smoking status: Former    Types: Cigars   Smokeless tobacco: Never   Tobacco comments:    Black and Mild Cigars, 2 cigars/week  Vaping Use   Vaping Use: Never used  Substance and Sexual Activity   Alcohol use: Yes    Comment: socially   Drug use: Yes    Types: Marijuana    Comment: 01/07/21 1-2 x daily   Sexual activity: Yes    Birth control/protection: Pill  Other Topics Concern   Not on file  Social History Narrative   Lives with sig other   Social Determinants of Health   Financial Resource Strain: Not on file  Food Insecurity: Not on file  Transportation Needs: Not on file  Physical Activity: Not on file  Stress: Not on file  Social Connections: Not on file  Intimate Partner  Violence: Not on file    Family History  Problem Relation Age of Onset   Hypertension Maternal Grandmother    Kidney cancer Maternal Grandmother    Hypertension Mother    Heart disease Mother        MI   Pulmonary embolism Mother    Thyroid disease Mother    Hernia Mother    Diabetes Maternal Uncle    Diabetes Maternal Grandfather    Hypertension Maternal Grandfather    Cirrhosis Maternal Aunt     Past Medical History:  Diagnosis Date   Asthma    prn inhaler   Complication of anesthesia 04/27/2015   vomiting and laryngospasm on extubation, requiring CPAP, jaw thrust and succinylcholine   Hidradenitis axillaris 07/2015   right   Irregular periods    Hx - now on OCP daily   Obesity    Pollen allergy    PONV (postoperative nausea and vomiting)     Patient Active Problem List   Diagnosis Date Noted   Migraine without aura and without status migrainosus, not intractable 01/07/2021   Cervical myofascial pain syndrome 01/07/2021   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   Peritonsillar abscess 02/18/2019   Obesity 06/28/2016   Hidradenitis suppurativa 06/28/2016   Amenorrhea 06/28/2016    Past Surgical History:  Procedure Laterality Date   HYDRADENITIS EXCISION Left 04/27/2015   Procedure: EXCISION HIDRADENITIS LEFT AXILLA [name] CLOSURE ;  Surgeon: Louisa Second, MD;  Location: [facility];  Service: Plastics;  Laterality: Left;   HYDRADENITIS EXCISION Right 08/10/2015   Procedure: EXCISION HIDRADENITIS RIGHT AXILLA, RYAN POLLOCK CLOSURE;  Surgeon: Louisa Second, MD;  Location: [facility];  Service: Plastics;  Laterality: Right;   TONSILLECTOMY Bilateral 03/15/2019   Procedure: TONSILLECTOMY;  Surgeon: Graylin Shiver, MD;  Location: [facility];  Service: ENT;  Laterality: Bilateral;    Current Outpatient Medications  Medication Sig Dispense Refill   acetaminophen (TYLENOL) 325 MG tablet Take 2 tablets (650 mg total) by mouth every  6 (six) hours.     albuterol (PROVENTIL HFA;VENTOLIN HFA) 108 (90 Base) MCG/ACT inhaler Inhale 1-2 puffs into the lungs every 6 (six) hours as needed for wheezing or shortness of breath. 1 Inhaler 0   Baclofen 5 MG TABS Take 1 tablet by mouth 3 (three) times daily as needed.     cyclobenzaprine (FLEXERIL) 10 MG tablet Take 1 tablet (10 mg total) by mouth 2 (two) times daily as needed for muscle spasms. 20 tablet 0   gabapentin (NEURONTIN) 300 MG capsule Take 1 capsule (300 mg total) by mouth 3 (three) times daily. 90 capsule 11   ibuprofen (ADVIL) 800 MG tablet Take 1 tablet (800 mg total) by mouth every 8 (eight) hours as needed for moderate pain. 21 tablet 0   loratadine (CLARITIN) 10 MG tablet Take 10 mg by mouth daily as needed for allergies.   11   methocarbamol (ROBAXIN) 500 MG tablet Take 1 tablet (500 mg total) by mouth 3 (three) times daily. 90 tablet 3   naproxen (NAPROSYN) 500 MG tablet Take 1 tablet (500 mg total) by mouth 2 (two) times daily. 30 tablet 0   Norgestimate-Ethinyl Estradiol Triphasic 0.18/0.215/0.25 MG-25 MCG tab Take 1 tablet by mouth daily.   6   rizatriptan (MAXALT-MLT) 10 MG disintegrating tablet Take 1 tablet (10 mg total) by mouth as needed for migraine. May repeat in 2 hours if needed 9 tablet 11   traMADol (ULTRAM) 50 MG tablet Take 50 mg by mouth every 8 (eight) hours as needed.     No current facility-administered medications for this visit.    Allergies as of 03/15/2021 - Review Complete 03/15/2021  Allergen Reaction Noted   Bee pollen  12/04/2014   Pollen extract Other (See Comments) 12/04/2014    Vitals: BP (!) 147/75   Pulse 86   Ht  (1.651 m)   Wt (!) 333 lb (151 kg)   BMI 55.41 kg/m  Last Weight:  Wt Readings from Last 1 Encounters:  03/15/21 (!) 333 lb (151 kg)   Last Height:   Ht Readings from Last 1 Encounters:  03/15/21  (1.651 m)   Exam: NAD, pleasant                  Speech:    Speech is normal; fluent and spontaneous  with normal comprehension.  Cognition:    The patient is oriented to person, place, and time;     recent and remote memory intact;     language fluent;    Cranial Nerves:    The pupils are equal, round, and reactive to light.Trigeminal sensation  is intact and the muscles of mastication are normal. The face is symmetric. The palate elevates in the midline. Hearing intact. Voice is normal. Shoulder shrug is normal. The tongue has normal motion without fasciculations.   Coordination:  No dysmetria  Motor Observation:    No asymmetry, no atrophy, and no involuntary movements noted. Tone:    Normal muscle tone.     Strength:    Strength is V/V in the upper and lower limbs.      Sensation: intact to LT     Assessment/Plan:  20 year old with myofascial cervical pain triggering migraines. MRI brain and cervical spine unremarkable.  Addendum Jan 11, 2021: I reviewed MRI report of the cervical spine from [facility], at every level there was no disc herniation, central stenosis or foraminal stenosis, impression was "unremarkable MRI of the cervical spine" by review of report appears normal.  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 No startle reflex. Suggested the following instead: Bananas eat one every day, get some OTC magnesium like 500mg  daily, increase fluids, yellow mustard packets Also drink tonic water with quinine  - if labs normal and the above does not work, we can order an emg/ncs and further testing - mychart with updates  - migraines continue maxalt  - She went to [facility], had dry needling, feels better, completed -She has been going to PT for dry needling and taking methocarbamol,gabapentin for the myofascial pain and maxalt prn for migraines. On review of her med list, there is also baclofen, flexeril and tramadol on there now (discussed sedation and resp suppression if combining muscle relaxants and other sedating  medications) but patient states she rarely takes these - maxalt for migraine headaches: Doing well. Please take one tablet at the onset of your headache. If it does not improve the symptoms please take one additional tablet. Do not take more then 2 tablets in 24hrs. Do not take use more then 2 to 3 times in a week.    Orders Placed This Encounter  Procedures   CBC with Differential/Platelets   Comprehensive metabolic panel   Magnesium   TSH   CK    No orders of the defined types were placed in this encounter.  Discussed: To prevent or relieve headaches, try the following: Cool Compress. Lie down and place a cool compress on your head.  Avoid headache triggers. If certain foods or odors seem to have triggered your migraines in the past, avoid them. A headache diary might help you identify triggers.  Include physical activity in your daily routine. Try a daily walk or other moderate aerobic exercise.  Manage stress. Find healthy ways to cope with the stressors, such as delegating tasks on your to-do list.  Practice relaxation techniques. Try deep breathing, yoga, massage and visualization.  Eat regularly. Eating regularly scheduled meals and maintaining a healthy diet might help prevent headaches. Also, drink plenty of fluids.  Follow a regular sleep schedule. Sleep deprivation might contribute to headaches Consider biofeedback. With this mind-body technique, you learn to control certain bodily functions -- such as muscle tension, heart rate and blood pressure -- to prevent headaches or reduce headache pain.    Proceed to emergency room if you experience new or worsening symptoms or symptoms do not resolve, if you have new neurologic symptoms or if headache is severe, or for any concerning symptom.   Provided education and documentation from American headache Society toolbox including articles on: chronic  migraine medication overuse headache, chronic migraines, prevention of migraines,  behavioral and other nonpharmacologic treatments for headache.  Cc: Iona Hansen, NP,  Iona Hansen, NP  Naomie Dean, MD  [facility] [address] [place], [place] [address]  Phone [phone] Fax [phone]  I spent over 30 minutes of face-to-face and non-face-to-face time with patient on the  1. Muscle cramp   2. Muscle stiffness   3. Migraine without aura and without status migrainosus, not intractable   4. Cervical myofascial pain syndrome    diagnosis.  This included previsit chart review, lab review, study review, order entry, electronic health record documentation, patient education on the different diagnostic and therapeutic options, counseling and coordination of care, risks and benefits of management, compliance, or risk factor reduction

## 2021-03-15 NOTE — Patient Instructions (Addendum)
Bananas eat one every day, get some OTC magnesium like 500mg  daily, increase fluids, yellow mustard packets Also drink tonic water with quinine  - if labs normal and the above does not work, we can order an emg/ncs and further testing - mychart me  - migraines continue maxalt  Electromyoneurogram Electromyoneurogram is a test to check how well your muscles and nerves are working. This procedure includes the combined use of electromyogram (EMG) and nerve conduction study (NCS). EMG is used to look for muscular disorders. NCS, which is also called electroneurogram, measures how well your nerves arecontrolling your muscles. The procedures are usually done together to check if your muscles and nerves are healthy. If the results of the tests are abnormal, this may indicatedisease or injury, such as a neuromuscular disease or peripheral nerve damage. Tell a health care provider about: Any allergies you have. All medicines you are taking, including vitamins, herbs, eye drops, creams, and over-the-counter medicines. Any problems you or family members have had with anesthetic medicines. Any blood disorders you have. Any surgeries you have had. Any medical conditions you have. If you have a pacemaker. Whether you are pregnant or may be pregnant. What are the risks? Generally, this is a safe procedure. However, problems may occur, including: Infection where the electrodes were inserted. Bleeding. What happens before the procedure? Medicines Ask your health care provider about: Changing or stopping your regular medicines. This is especially important if you are taking diabetes medicines or blood thinners. Taking medicines such as aspirin and ibuprofen. These medicines can thin your blood. Do not take these medicines unless your health care provider tells you to take them. Taking over-the-counter medicines, vitamins, herbs, and supplements. General instructions Your health care provider may ask you  to avoid: Beverages that have caffeine, such as coffee and tea. Any products that contain nicotine or tobacco. These products include cigarettes, e-cigarettes, and chewing tobacco. If you need help quitting, ask your health care provider. Do not use lotions or creams on the same day that you will be having the procedure. What happens during the procedure? For EMG  Your health care provider will ask you to stay in a position so that he or she can access the muscle that will be studied. You may be standing, sitting, or lying down. You may be given a medicine that numbs the area (local anesthetic). A very thin needle that has an electrode will be inserted into your muscle. Another small electrode will be placed on your skin near the muscle. Your health care provider will ask you to continue to remain still. The electrodes will send a signal that tells about the electrical activity of your muscles. You may see this on a monitor or hear it in the room. After your muscles have been studied at rest, your health care provider will ask you to contract or flex your muscles. The electrodes will send a signal that tells about the electrical activity of your muscles. Your health care provider will remove the electrodes and the electrode needles when the procedure is finished. The procedure may vary among health care providers and hospitals. For NCS  An electrode that records your nerve activity (recording electrode) will be placed on your skin by the muscle that is being studied. An electrode that is used as a reference (reference electrode) will be placed near the recording electrode. A paste or gel will be applied to your skin between the recording electrode and the reference electrode. Your nerve will be stimulated  with a mild shock. Your health care provider will measure how much time it takes for your muscle to react. Your health care provider will remove the electrodes and the gel when the procedure  is finished. The procedure may vary among health care providers and hospitals. What happens after the procedure? It is up to you to get the results of your procedure. Ask your health care provider, or the department that is doing the procedure, when your results will be ready. Your health care provider may: Give you medicines for any pain. Monitor the insertion sites to make sure that bleeding stops. Summary Electromyoneurogram is a test to check how well your muscles and nerves are working. If the results of the tests are abnormal, this may indicate disease or injury. This is a safe procedure. However, problems may occur, such as bleeding and infection. Your health care provider will do two tests to complete this procedure. One checks your muscles (EMG) and another checks your nerves (NCS). It is up to you to get the results of your procedure. Ask your health care provider, or the department that is doing the procedure, when your results will be ready. This information is not intended to replace advice given to you by your health care provider. Make sure you discuss any questions you have with your healthcare provider. Document Revised: 05/01/2018 Document Reviewed: 04/13/2018 Elsevier Patient Education  2022 [employer].

## 2021-03-19 ENCOUNTER — Other Ambulatory Visit (INDEPENDENT_AMBULATORY_CARE_PROVIDER_SITE_OTHER): Payer: Self-pay

## 2021-03-19 DIAGNOSIS — M6289 Other specified disorders of muscle: Secondary | ICD-10-CM | POA: Diagnosis not present

## 2021-03-19 DIAGNOSIS — R252 Cramp and spasm: Secondary | ICD-10-CM | POA: Diagnosis not present

## 2021-03-19 DIAGNOSIS — Z0289 Encounter for other administrative examinations: Secondary | ICD-10-CM

## 2021-03-20 LAB — CBC WITH DIFFERENTIAL/PLATELET
Basophils Absolute: 0 10*3/uL (ref 0.0–0.2)
Basos: 0 %
EOS (ABSOLUTE): 0.2 10*3/uL (ref 0.0–0.4)
Eos: 3 %
Hematocrit: 39.7 % (ref 34.0–46.6)
Hemoglobin: 13.5 g/dL (ref 11.1–15.9)
Immature Grans (Abs): 0 10*3/uL (ref 0.0–0.1)
Immature Granulocytes: 0 %
Lymphocytes Absolute: 2.6 10*3/uL (ref 0.7–3.1)
Lymphs: 36 %
MCH: 29.3 pg (ref 26.6–33.0)
MCHC: 34 g/dL (ref 31.5–35.7)
MCV: 86 fL (ref 79–97)
Monocytes Absolute: 0.5 10*3/uL (ref 0.1–0.9)
Monocytes: 7 %
Neutrophils Absolute: 3.8 10*3/uL (ref 1.4–7.0)
Neutrophils: 54 %
Platelets: 269 10*3/uL (ref 150–450)
RBC: 4.6 x10E6/uL (ref 3.77–5.28)
RDW: 11.8 % (ref 11.7–15.4)
WBC: 7.1 10*3/uL (ref 3.4–10.8)

## 2021-03-20 LAB — COMPREHENSIVE METABOLIC PANEL
ALT: 33 IU/L — ABNORMAL HIGH (ref 0–32)
AST: 28 IU/L (ref 0–40)
Albumin/Globulin Ratio: 1.3 (ref 1.2–2.2)
Albumin: 4 g/dL (ref 3.9–5.0)
Alkaline Phosphatase: 83 IU/L (ref 44–121)
BUN/Creatinine Ratio: 11 (ref 9–23)
BUN: 7 mg/dL (ref 6–20)
Bilirubin Total: 0.6 mg/dL (ref 0.0–1.2)
CO2: 22 mmol/L (ref 20–29)
Calcium: 9 mg/dL (ref 8.7–10.2)
Chloride: 106 mmol/L (ref 96–106)
Creatinine, Ser: 0.63 mg/dL (ref 0.57–1.00)
Globulin, Total: 3.1 g/dL (ref 1.5–4.5)
Glucose: 89 mg/dL (ref 65–99)
Potassium: 4.7 mmol/L (ref 3.5–5.2)
Sodium: 142 mmol/L (ref 134–144)
Total Protein: 7.1 g/dL (ref 6.0–8.5)
eGFR: 129 mL/min/{1.73_m2} (ref >59)

## 2021-03-20 LAB — CK: Total CK: 724 U/L (ref 32–182)

## 2021-03-20 LAB — MAGNESIUM: Magnesium: 1.9 mg/dL (ref 1.6–2.3)

## 2021-03-20 LAB — TSH: TSH: 1.8 u[IU]/mL (ref 0.450–4.500)

## 2021-03-22 ENCOUNTER — Other Ambulatory Visit: Payer: Self-pay | Admitting: Neurology

## 2021-03-22 ENCOUNTER — Telehealth: Payer: Self-pay | Admitting: *Deleted

## 2021-03-22 DIAGNOSIS — R252 Cramp and spasm: Secondary | ICD-10-CM

## 2021-03-22 DIAGNOSIS — M629 Disorder of muscle, unspecified: Secondary | ICD-10-CM

## 2021-03-22 DIAGNOSIS — R748 Abnormal levels of other serum enzymes: Secondary | ICD-10-CM

## 2021-03-22 DIAGNOSIS — M6289 Other specified disorders of muscle: Secondary | ICD-10-CM

## 2021-03-22 NOTE — Telephone Encounter (Signed)
I called the pt and LVM (ok per DPR) advising pt of lab results which show elevated CK. Asked for patient to call back and discuss as Dr Lucia Gaskins advises a repeat CK level as well as EMG/NCV. Patient will need to let us know if she is ok with proceeding then orders need to be placed.

## 2021-03-22 NOTE — Telephone Encounter (Signed)
-----   Message from Anson Fret, MD sent at 03/20/2021  5:20 PM EDT ----- Her CK is abnormal, this is creatinine kinase, and is a muscle enzyme. Not sure why it is elevated but it signifies some muscle breakdown. I'd like for her to come back to the office for a muscle test and to repeat the CK. Please call and discuss with patient and if she is kok with it let me know so I can order an emg/ncs and a repeat K or place the orders if you are comfortable and let me know thanks

## 2021-03-22 NOTE — Telephone Encounter (Signed)
The patient returned my call.  Her questions were answered and she agreed to proceed with repeat labs plus EMG nerve conduction study.  Patient stated she has been eating the bananas, taking magnesium, she has also drink water and Gatorade but has forgotten about mustard and has not found tonic water.  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  I discussed with Dr. Lucia Gaskins.  She will order more labs.  The patient stated last time after multiple attempts she had to go to [facility] on [address]. I let her know we would place the orders and release them so they could be drawn there.  Patient will plan to go tomorrow.  We also have scheduled her for an EMG for this Thursday, July 28 at 7:45 AM.

## 2021-03-22 NOTE — Addendum Note (Signed)
Addended by: Bertram Savin on: 03/22/2021 12:23 PM   Modules accepted: Orders

## 2021-03-23 NOTE — Telephone Encounter (Signed)
Lab order requisition faxed to [facility] [address] at [phone]. Received a receipt of confirmation.

## 2021-03-23 NOTE — Telephone Encounter (Signed)
Spoke with the patient and discussed Dr. Darreld Mclean message.  Patient understands more labs have been ordered, also asked her not to exercise excessively or lift weights as that can elevate the CK. Also asked her to hydrate. Pt verbalized understanding. She will be going to [facility] soon. I let her know I would have our [facility] team release the orders so the other location can draw. Will call her back if there are any problems. Pt verbalized appreciation.

## 2021-03-24 DIAGNOSIS — M629 Disorder of muscle, unspecified: Secondary | ICD-10-CM | POA: Diagnosis not present

## 2021-03-25 ENCOUNTER — Other Ambulatory Visit: Payer: Self-pay | Admitting: Neurology

## 2021-03-25 ENCOUNTER — Telehealth: Payer: Self-pay

## 2021-03-25 ENCOUNTER — Ambulatory Visit (INDEPENDENT_AMBULATORY_CARE_PROVIDER_SITE_OTHER): Payer: Medicaid Other | Admitting: Neurology

## 2021-03-25 ENCOUNTER — Ambulatory Visit: Payer: Medicaid Other | Admitting: Neurology

## 2021-03-25 DIAGNOSIS — R748 Abnormal levels of other serum enzymes: Secondary | ICD-10-CM

## 2021-03-25 DIAGNOSIS — R252 Cramp and spasm: Secondary | ICD-10-CM

## 2021-03-25 DIAGNOSIS — Z0289 Encounter for other administrative examinations: Secondary | ICD-10-CM

## 2021-03-25 DIAGNOSIS — M6289 Other specified disorders of muscle: Secondary | ICD-10-CM

## 2021-03-25 NOTE — Telephone Encounter (Signed)
I called pt and updated on labs resulted so far.  Pt will plan to return to the office in 1-2 week for repeat lab check.  Pt also advised once the pending labs finalize we will call back to review.  Pt verbalized understanding/appreciation for the call.

## 2021-03-25 NOTE — Procedures (Signed)
Full Name: Denielle Bayard Gender: Female MRN #: [id number] Date of Birth: 25-Oct-2000    Visit Date: 03/25/2021 07:52 Age: 20 Years Examining Physician: Naomie Dean, MD  Requesting Provider: Iona Hansen, NP Primary Care Provider:  Iona Hansen, NP   History: Patient with muscle cramps and elevated CK.   Summary: EMG/NCS performed on the right upper and right lower extremities. All nerves and muscles (as indicated in the following tables) were within normal limits.    Conclusion: This is a normal study of the right upper and right lower extremities. No evidence for neuropathy, radiculopathy or muscle disease.    Naomie Dean, M.D.  [facility] [address] [place], [place] [address] Tel: [phone] Fax: [phone]  Verbal informed consent was obtained from the patient, patient was informed of potential risk of procedure, including bruising, bleeding, hematoma formation, infection, muscle weakness, muscle pain, numbness, among others.        MNC    Nerve / Sites Muscle Latency Ref. Amplitude Ref. Rel Amp Segments Distance Velocity Ref. Area    ms ms mV mV %  cm m/s m/s mVms  R Median - APB     Wrist APB 3.3 ?4.4 8.1 ?4.0 100 Wrist - APB 7   24.5     Upper arm APB 7.5  4.9  60.7 Upper arm - Wrist 22 52 ?49 14.6  R Ulnar - ADM     Wrist ADM 2.2 ?3.3 10.4 ?6.0 100 Wrist - ADM 7   30.9     B.Elbow ADM 5.6  8.9  85.1 B.Elbow - Wrist 19 55 ?49 25.6     A.Elbow ADM 7.5  9.8  110 A.Elbow - B.Elbow 10 52 ?49 28.1  R Peroneal - EDB     Ankle EDB 3.4 ?6.5 8.7 ?2.0 100 Ankle - EDB 9   26.0     Fib head EDB 8.9  5.7  64.9 Fib head - Ankle 28 51 ?44 18.0     Pop fossa EDB 11.0  6.8  120 Pop fossa - Fib head 10 49 ?44 21.7         Pop fossa - Ankle      R Tibial - AH     Ankle AH 3.3 ?5.8 6.4 ?4.0 100 Ankle - AH 9   14.9     Pop fossa AH 12.6  1.5  23.3 Pop fossa - Ankle 38 41 ?41 3.3             SNC    Nerve / Sites Rec. Site Peak Lat Ref.   Amp Ref. Segments Distance Peak Diff Ref.    ms ms V V  cm ms ms  R Sural - Ankle (Calf)     Calf Ankle 3.1 ?4.4 10 ?6 Calf - Ankle 14    R Superficial peroneal - Ankle     Lat leg Ankle 3.1 ?4.4 7 ?6 Lat leg - Ankle 14    R Median, Ulnar - Transcarpal comparison     Median Palm Wrist 2.3 ?2.2 46 ?35 Median Palm - Wrist 8       Ulnar Palm Wrist 1.9 ?2.2 13 ?12 Ulnar Palm - Wrist 8          Median Palm - Ulnar Palm  0.4 ?0.4  R Median - Orthodromic (Dig II, Mid palm)     Dig II Wrist 3.1 ?3.4 12 ?10 Dig II - Wrist 13  R Ulnar - Orthodromic, (Dig V, Mid palm)     Dig V Wrist 2.4 ?3.1 11 ?5 Dig V - Wrist 39                 F  Wave    Nerve F Lat Ref.   ms ms  R Tibial - AH 47.2 ?56.0  R Ulnar - ADM 28.2 ?32.0         EMG Summary Table    Spontaneous MUAP Recruitment  Muscle IA Fib PSW Fasc Other Amp Dur. Poly Pattern  R. Deltoid Normal None None None _______ Normal Normal Normal Normal  R. Biceps brachii Normal None None None _______ Normal Normal Normal Normal  R. Triceps brachii Normal None None None _______ Normal Normal Normal Normal  R. Pronator teres Normal None None None _______ Normal Normal Normal Normal  R. First dorsal interosseous Normal None None None _______ Normal Normal Normal Normal  R. Extensor indicis proprius Normal None None None _______ Normal Normal Normal Normal  R. Iliopsoas Normal None None None _______ Normal Normal Normal Normal  R. Vastus medialis Normal None None None _______ Normal Normal Normal Normal  R. Tibialis anterior Normal None None None _______ Normal Normal Normal Normal  R. Gastrocnemius (Medial head) Normal None None None _______ Normal Normal Normal Normal  R. Extensor hallucis longus Normal None None None _______ Normal Normal Normal Normal  R. Thoracic paraspinals (mid) Normal None None None _______ Normal Normal Normal Normal  R. Cervical paraspinals (low) Normal None None None _______ Normal Normal Normal Normal

## 2021-03-25 NOTE — Progress Notes (Signed)
Full Name: Denielle Bayard Gender: Female MRN #: [id number] Date of Birth: 25-Oct-2000    Visit Date: 03/25/2021 07:52 Age: 20 Years Examining Physician: Naomie Dean, MD  Requesting Provider: Iona Hansen, NP Primary Care Provider:  Iona Hansen, NP   History: Patient with muscle cramps and elevated CK.   Summary: EMG/NCS performed on the right upper and right lower extremities. All nerves and muscles (as indicated in the following tables) were within normal limits.    Conclusion: This is a normal study of the right upper and right lower extremities. No evidence for neuropathy, radiculopathy or muscle disease.    Naomie Dean, M.D.  [facility] [address] [place], [place] [address] Tel: [phone] Fax: [phone]  Verbal informed consent was obtained from the patient, patient was informed of potential risk of procedure, including bruising, bleeding, hematoma formation, infection, muscle weakness, muscle pain, numbness, among others.        MNC    Nerve / Sites Muscle Latency Ref. Amplitude Ref. Rel Amp Segments Distance Velocity Ref. Area    ms ms mV mV %  cm m/s m/s mVms  R Median - APB     Wrist APB 3.3 ?4.4 8.1 ?4.0 100 Wrist - APB 7   24.5     Upper arm APB 7.5  4.9  60.7 Upper arm - Wrist 22 52 ?49 14.6  R Ulnar - ADM     Wrist ADM 2.2 ?3.3 10.4 ?6.0 100 Wrist - ADM 7   30.9     B.Elbow ADM 5.6  8.9  85.1 B.Elbow - Wrist 19 55 ?49 25.6     A.Elbow ADM 7.5  9.8  110 A.Elbow - B.Elbow 10 52 ?49 28.1  R Peroneal - EDB     Ankle EDB 3.4 ?6.5 8.7 ?2.0 100 Ankle - EDB 9   26.0     Fib head EDB 8.9  5.7  64.9 Fib head - Ankle 28 51 ?44 18.0     Pop fossa EDB 11.0  6.8  120 Pop fossa - Fib head 10 49 ?44 21.7         Pop fossa - Ankle      R Tibial - AH     Ankle AH 3.3 ?5.8 6.4 ?4.0 100 Ankle - AH 9   14.9     Pop fossa AH 12.6  1.5  23.3 Pop fossa - Ankle 38 41 ?41 3.3             SNC    Nerve / Sites Rec. Site Peak Lat Ref.   Amp Ref. Segments Distance Peak Diff Ref.    ms ms V V  cm ms ms  R Sural - Ankle (Calf)     Calf Ankle 3.1 ?4.4 10 ?6 Calf - Ankle 14    R Superficial peroneal - Ankle     Lat leg Ankle 3.1 ?4.4 7 ?6 Lat leg - Ankle 14    R Median, Ulnar - Transcarpal comparison     Median Palm Wrist 2.3 ?2.2 46 ?35 Median Palm - Wrist 8       Ulnar Palm Wrist 1.9 ?2.2 13 ?12 Ulnar Palm - Wrist 8          Median Palm - Ulnar Palm  0.4 ?0.4  R Median - Orthodromic (Dig II, Mid palm)     Dig II Wrist 3.1 ?3.4 12 ?10 Dig II - Wrist 13  R Ulnar - Orthodromic, (Dig V, Mid palm)     Dig V Wrist 2.4 ?3.1 11 ?5 Dig V - Wrist 11                 F  Wave    Nerve F Lat Ref.   ms ms  R Tibial - AH 47.2 ?56.0  R Ulnar - ADM 28.2 ?32.0         EMG Summary Table    Spontaneous MUAP Recruitment  Muscle IA Fib PSW Fasc Other Amp Dur. Poly Pattern  R. Deltoid Normal None None None _______ Normal Normal Normal Normal  R. Biceps brachii Normal None None None _______ Normal Normal Normal Normal  R. Triceps brachii Normal None None None _______ Normal Normal Normal Normal  R. Pronator teres Normal None None None _______ Normal Normal Normal Normal  R. First dorsal interosseous Normal None None None _______ Normal Normal Normal Normal  R. Extensor indicis proprius Normal None None None _______ Normal Normal Normal Normal  R. Iliopsoas Normal None None None _______ Normal Normal Normal Normal  R. Vastus medialis Normal None None None _______ Normal Normal Normal Normal  R. Tibialis anterior Normal None None None _______ Normal Normal Normal Normal  R. Gastrocnemius (Medial head) Normal None None None _______ Normal Normal Normal Normal  R. Extensor hallucis longus Normal None None None _______ Normal Normal Normal Normal  R. Thoracic paraspinals (mid) Normal None None None _______ Normal Normal Normal Normal  R. Cervical paraspinals (low) Normal None None None _______ Normal Normal Normal Normal     

## 2021-03-25 NOTE — Progress Notes (Signed)
See procedure note.

## 2021-03-25 NOTE — Telephone Encounter (Signed)
Spoke with patient and advised stiff person syndrome antibodies negative. She does not have this condition. Pt verbalized appreciation.  Anson Fret, MD  03/25/2021  5:16 PM EDT Back to Top     Stiff-person syndrome antibodies negative, she does not have this. thanks

## 2021-03-25 NOTE — Telephone Encounter (Signed)
-----   Message from Anson Fret, MD sent at 03/25/2021 12:49 PM EDT ----- Muscle enzymes ave significantly improved (from 724 to 470). Would like her back in 1-2 weeks to recheck. Possibly she was dehydrated or there was another cause like strenuous lifting to explain her muscle enzymes to increase (meaning muscle breakdown) but appears to be improving. We will watch it, come back in 1-2 weeks for repeat, still awaiting more lab results

## 2021-03-25 NOTE — Progress Notes (Signed)
Spoke with patient and advised stiff person syndrome antibodies negative. She does not have this condition. Pt verbalized appreciation.

## 2021-03-31 LAB — VITAMIN B6: Vitamin B6: 10.7 ug/L (ref 3.4–65.2)

## 2021-03-31 LAB — MULTIPLE MYELOMA PANEL, SERUM
Albumin SerPl Elph-Mcnc: 3.6 g/dL (ref 2.9–4.4)
Albumin/Glob SerPl: 1.3 (ref 0.7–1.7)
Alpha 1: 0.2 g/dL (ref 0.0–0.4)
Alpha2 Glob SerPl Elph-Mcnc: 0.6 g/dL (ref 0.4–1.0)
B-Globulin SerPl Elph-Mcnc: 1.3 g/dL (ref 0.7–1.3)
Gamma Glob SerPl Elph-Mcnc: 1 g/dL (ref 0.4–1.8)
Globulin, Total: 3 g/dL (ref 2.2–3.9)
IgA/Immunoglobulin A, Serum: 364 mg/dL — ABNORMAL HIGH (ref 87–352)
IgG (Immunoglobin G), Serum: 1037 mg/dL (ref 586–1602)
IgM (Immunoglobulin M), Srm: 245 mg/dL — ABNORMAL HIGH (ref 26–217)
Total Protein: 6.6 g/dL (ref 6.0–8.5)

## 2021-03-31 LAB — CK: Total CK: 470 U/L — ABNORMAL HIGH (ref 32–182)

## 2021-03-31 LAB — ANCA PROFILE
Anti-MPO Antibodies: <0.2 units (ref 0.0–0.9)
Anti-PR3 Antibodies: <0.2 units (ref 0.0–0.9)
Atypical pANCA: <1:20 {titer}
C-ANCA: <1:20 {titer}
P-ANCA: <1:20 {titer}

## 2021-03-31 LAB — RHEUMATOID FACTOR: Rheumatoid fact SerPl-aCnc: <10 IU/mL (ref <14.0)

## 2021-03-31 LAB — VITAMIN B1: Thiamine: 134.4 nmol/L (ref 66.5–200.0)

## 2021-03-31 LAB — GLUTAMIC ACID DECARBOXYLASE AUTO ABS: Glutamic Acid Decarb Ab: <5 U/mL (ref 0.0–5.0)

## 2021-03-31 LAB — C-REACTIVE PROTEIN: CRP: 17 mg/L — ABNORMAL HIGH (ref 0–10)

## 2021-03-31 LAB — ANTINUCLEAR ANTIBODIES, IFA: ANA Titer 1: NEGATIVE

## 2021-03-31 LAB — HEMOGLOBIN A1C
Est. average glucose Bld gHb Est-mCnc: 97 mg/dL
Hgb A1c MFr Bld: 5 % (ref 4.8–5.6)

## 2021-03-31 LAB — HEAVY METALS, BLOOD
Arsenic: 2 ug/L (ref 0–9)
Lead, Blood: <1 ug/dL (ref 0–4)
Mercury: <1 ug/L (ref 0.0–14.9)

## 2021-03-31 LAB — SJOGREN'S SYNDROME ANTIBODS(SSA + SSB)
ENA SSA (RO) Ab: <0.2 AI (ref 0.0–0.9)
ENA SSB (LA) Ab: <0.2 AI (ref 0.0–0.9)

## 2021-03-31 LAB — B12 AND FOLATE PANEL
Folate: 10.5 ng/mL (ref >3.0)
Vitamin B-12: 423 pg/mL (ref 232–1245)

## 2021-03-31 LAB — SEDIMENTATION RATE: Sed Rate: 16 mm/hr (ref 0–32)

## 2021-03-31 LAB — METHYLMALONIC ACID, SERUM: Methylmalonic Acid: 71 nmol/L (ref 0–378)

## 2021-04-02 ENCOUNTER — Other Ambulatory Visit: Payer: Self-pay

## 2021-04-02 ENCOUNTER — Encounter (HOSPITAL_COMMUNITY): Payer: Self-pay

## 2021-04-02 ENCOUNTER — Ambulatory Visit (HOSPITAL_COMMUNITY)
Admission: EM | Admit: 2021-04-02 | Discharge: 2021-04-02 | Disposition: A | Discharge status: Home / Self Care | Payer: Medicaid Other | Attending: Internal Medicine | Admitting: Internal Medicine

## 2021-04-02 DIAGNOSIS — R1084 Generalized abdominal pain: Secondary | ICD-10-CM

## 2021-04-02 LAB — POCT URINALYSIS DIPSTICK, ED / UC
Bilirubin Urine: NEGATIVE
Glucose, UA: NEGATIVE mg/dL
Ketones, ur: NEGATIVE mg/dL
Leukocytes,Ua: NEGATIVE
Nitrite: NEGATIVE
Protein, ur: NEGATIVE mg/dL
Specific Gravity, Urine: >=1.03 (ref 1.005–1.030)
Urobilinogen, UA: 1 mg/dL (ref 0.0–1.0)
pH: 6 (ref 5.0–8.0)

## 2021-04-02 LAB — POC URINE PREG, ED: Preg Test, Ur: NEGATIVE

## 2021-04-02 NOTE — Discharge Instructions (Addendum)
Make sure you are drinking plenty of fluids, especially water.  Try to increase the amount of fiber in your diet.    If you are having hard or painful bowel movements, you can take colace or another stool softener.  Take Miralax as needed for constipation.    If you develop any nausea, vomiting, diarrhea, worsening abdominal pain, or fevers, go to the ED for further evaluation.  Follow up with your primary care provider as needed for re-evaluation.

## 2021-04-02 NOTE — ED Provider Notes (Signed)
[facility]    CSN: [id number] Arrival date & time: 04/02/21  1237      History   Chief Complaint Chief Complaint  Patient presents with   Abdominal Pain    HPI Samantha Morse is a 20 y.o. female.   Patient here for evaluation of abdominal and side pain that has been ongoing for the past several days. Reports pain is a sharp, stabbing pain.  Reports initially thought it was her period but states that her period has not actually started.  Denies any nausea, vomiting, diarrhea, or constipation.  Reports last BM this am and was harder than normal.  Denies any trauma, injury, or other precipitating event.  Denies any specific alleviating or aggravating factors.  Denies any fevers, chest pain, shortness of breath, numbness, tingling, weakness, or headaches.     The history is provided by the patient.  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   Irregular periods    Hx - now on OCP daily   Obesity    Pollen allergy    PONV (postoperative nausea and vomiting)     Patient Active Problem List   Diagnosis Date Noted   Migraine without aura and without status migrainosus, not intractable 01/07/2021   Cervical myofascial pain syndrome 01/07/2021   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   Peritonsillar abscess 02/18/2019   Obesity 06/28/2016   Hidradenitis suppurativa 06/28/2016   Amenorrhea 06/28/2016    Past Surgical History:  Procedure Laterality Date   HYDRADENITIS EXCISION Left 04/27/2015   Procedure: EXCISION HIDRADENITIS LEFT AXILLA [name] CLOSURE ;  Surgeon: Louisa Second, MD;  Location: [facility];  Service: Plastics;  Laterality: Left;   HYDRADENITIS  EXCISION Right 08/10/2015   Procedure: EXCISION HIDRADENITIS RIGHT AXILLA, RYAN POLLOCK CLOSURE;  Surgeon: Louisa Second, MD;  Location: [facility];  Service: Plastics;  Laterality: Right;   TONSILLECTOMY Bilateral 03/15/2019   Procedure: TONSILLECTOMY;  Surgeon: Graylin Shiver, MD;  Location: [facility];  Service: ENT;  Laterality: Bilateral;    OB History     Gravida  0   Para  0   Term  0   Preterm  0   AB  0   Living  0      SAB  0   IAB  0   Ectopic  0   Multiple  0   Live Births  0            Home Medications    Prior to Admission medications   Medication Sig Start Date End Date Taking? Authorizing Provider  acetaminophen (TYLENOL) 325 MG tablet Take 2 tablets (650 mg total) by mouth every 6 (six) hours. 03/15/19   Marcellino, Glee Arvin, MD  albuterol (PROVENTIL HFA;VENTOLIN HFA) 108 (90 Base) MCG/ACT inhaler Inhale 1-2 puffs into the lungs every 6 (six) hours as needed for wheezing or shortness of breath. 12/03/18   Rancour, Jeannett Senior, MD  Baclofen 5 MG TABS Take 1 tablet by mouth 3 (three) times daily as needed. 11/06/20   [provider]  cyclobenzaprine (FLEXERIL) 10 MG tablet Take 1 tablet (10 mg total) by mouth 2 (two) times daily as needed for muscle spasms. 04/24/20  Moshe Cipro, NP  gabapentin (NEURONTIN) 300 MG capsule Take 1 capsule (300 mg total) by mouth 3 (three) times daily. 01/07/21   Anson Fret, MD  ibuprofen (ADVIL) 800 MG tablet Take 1 tablet (800 mg total) by mouth every 8 (eight) hours as needed for moderate pain. 04/24/20   Moshe Cipro, NP  loratadine (CLARITIN) 10 MG tablet Take 10 mg by mouth daily as needed for allergies.  05/23/18   [provider]  methocarbamol (ROBAXIN) 500 MG tablet Take 1 tablet (500 mg total) by mouth 3 (three) times daily. 01/07/21   Anson Fret, MD  naproxen (NAPROSYN) 500 MG tablet Take 1 tablet (500 mg total) by mouth 2 (two) times daily. 11/02/20   Kugler,  [place], MD  Norgestimate-Ethinyl Estradiol Triphasic 0.18/0.215/0.25 MG-25 MCG tab Take 1 tablet by mouth daily.  03/20/18   [provider]  rizatriptan (MAXALT-MLT) 10 MG disintegrating tablet Take 1 tablet (10 mg total) by mouth as needed for migraine. May repeat in 2 hours if needed 01/07/21   Anson Fret, MD  traMADol (ULTRAM) 50 MG tablet Take 50 mg by mouth every 8 (eight) hours as needed. 11/06/20   [provider]    Family History Family History  Problem Relation Age of Onset   Hypertension Maternal Grandmother    Kidney cancer Maternal Grandmother    Hypertension Mother    Heart disease Mother        MI   Pulmonary embolism Mother    Thyroid disease Mother    Hernia Mother    Diabetes Maternal Uncle    Diabetes Maternal Grandfather    Hypertension Maternal Grandfather    Cirrhosis Maternal Aunt     Social History Social History   Tobacco Use   Smoking status: Former    Types: Cigars   Smokeless tobacco: Never   Tobacco comments:    Black and Mild Cigars, 2 cigars/week  Vaping Use   Vaping Use: Never used  Substance Use Topics   Alcohol use: Yes    Comment: socially   Drug use: Yes    Types: Marijuana    Comment: 01/07/21 1-2 x daily     Allergies   Bee pollen and Pollen extract   Review of Systems Review of Systems  Gastrointestinal:  Positive for abdominal pain. Negative for constipation, diarrhea, nausea and vomiting.  All other systems reviewed and are negative.   Physical Exam Triage Vital Signs ED Triage Vitals  Enc Vitals Group     BP 04/02/21 1357 (!) 149/98     Pulse Rate 04/02/21 1357 85     Resp 04/02/21 1357 20     Temp 04/02/21 1357 98.2 F (36.8 C)     Temp Source 04/02/21 1357 Oral     SpO2 04/02/21 1357 100 %     Weight --      Height --      Head Circumference --      Peak Flow --      Pain Score 04/02/21 1355 7     Pain Loc --      Pain Edu? --      Excl. in GC? --    No data found.  Updated  Vital Signs BP (!) 149/98 (BP Location: Right Arm)   Pulse 85   Temp 98.2 F (36.8 C) (Oral)   Resp 20   LMP  (LMP Unknown)   SpO2 100%   Visual Acuity Right Eye Distance:   Left  Eye Distance:   Bilateral Distance:    Right Eye Near:   Left Eye Near:    Bilateral Near:     Physical Exam Vitals and nursing note reviewed.  Constitutional:      General: She is not in acute distress.    Appearance: Normal appearance. She is not ill-appearing, toxic-appearing or diaphoretic.  HENT:     Head: Normocephalic and atraumatic.  Eyes:     Conjunctiva/sclera: Conjunctivae normal.  Cardiovascular:     Rate and Rhythm: Normal rate.     Pulses: Normal pulses.  Pulmonary:     Effort: Pulmonary effort is normal.  Abdominal:     General: Abdomen is flat.     Palpations: Abdomen is soft.     Tenderness: There is generalized abdominal tenderness. There is no right CVA tenderness, left CVA tenderness or guarding. Negative signs include Rovsing's sign, psoas sign and obturator sign.  Musculoskeletal:        General: Normal range of motion.     Cervical back: Normal range of motion.  Skin:    General: Skin is warm and dry.  Neurological:     General: No focal deficit present.     Mental Status: She is alert and oriented to person, place, and time.  Psychiatric:        Mood and Affect: Mood normal.     UC Treatments / Results  Labs (all labs ordered are listed, but only abnormal results are displayed) Labs Reviewed  POCT URINALYSIS DIPSTICK, ED / UC - Abnormal; Notable for the following components:      Result Value   Hgb urine dipstick TRACE (*)    All other components within normal limits  POC URINE PREG, ED    EKG   Radiology No results found.  Procedures Procedures (including critical care time)  Medications Ordered in UC Medications - No data to display  Initial Impression / Assessment and Plan / UC Course  I have reviewed the triage vital signs and the nursing  notes.  Pertinent labs & imaging results that were available during my care of the patient were reviewed by me and considered in my medical decision making (see chart for details).    Assessment negative for red flags or concerns.  Abdominal pain, possible gastris or viral inflammation.  Urine pregnancy test negative, urinalysis positive for hgb but otherwise negative.  Encouraged fluid intake and increase fiber in diet.  Discussed stool softener for hard or painful BM and Miralax for constipation.  Strict ED follow up for any red flag symptoms.  Follow up with primary care as needed for re-evaluation.  Final Clinical Impressions(s) / UC Diagnoses   Final diagnoses:  Generalized abdominal pain     Discharge Instructions      Make sure you are drinking plenty of fluids, especially water.  Try to increase the amount of fiber in your diet.    If you are having hard or painful bowel movements, you can take colace or another stool softener.  Take Miralax as needed for constipation.    If you develop any nausea, vomiting, diarrhea, worsening abdominal pain, or fevers, go to the ED for further evaluation.  Follow up with your primary care provider as needed for re-evaluation.       ED Prescriptions   None    PDMP not reviewed this encounter.   Ivette Loyal, NP 04/02/21 1501

## 2021-04-02 NOTE — ED Triage Notes (Signed)
Pt presents with a striking pain on the waist line and stomach.   States she thought it had to do with her menstrual cycle, states she is not due for it a cycle yet. States the pain has worsened.

## 2021-06-11 ENCOUNTER — Emergency Department (HOSPITAL_COMMUNITY)
Admission: EM | Admit: 2021-06-11 | Discharge: 2021-06-11 | Disposition: A | Discharge status: Home / Self Care | Payer: Medicaid Other | Attending: Emergency Medicine | Admitting: Emergency Medicine

## 2021-06-11 ENCOUNTER — Other Ambulatory Visit: Payer: Self-pay

## 2021-06-11 DIAGNOSIS — N9489 Other specified conditions associated with female genital organs and menstrual cycle: Secondary | ICD-10-CM | POA: Insufficient documentation

## 2021-06-11 DIAGNOSIS — R1084 Generalized abdominal pain: Secondary | ICD-10-CM | POA: Diagnosis not present

## 2021-06-11 DIAGNOSIS — Z87891 Personal history of nicotine dependence: Secondary | ICD-10-CM | POA: Insufficient documentation

## 2021-06-11 DIAGNOSIS — J45909 Unspecified asthma, uncomplicated: Secondary | ICD-10-CM | POA: Insufficient documentation

## 2021-06-11 LAB — COMPREHENSIVE METABOLIC PANEL
ALT: 36 U/L (ref 0–44)
AST: 27 U/L (ref 15–41)
Albumin: 3.6 g/dL (ref 3.5–5.0)
Alkaline Phosphatase: 70 U/L (ref 38–126)
Anion gap: 10 (ref 5–15)
BUN: <5 mg/dL — ABNORMAL LOW (ref 6–20)
CO2: 22 mmol/L (ref 22–32)
Calcium: 9.1 mg/dL (ref 8.9–10.3)
Chloride: 104 mmol/L (ref 98–111)
Creatinine, Ser: 0.6 mg/dL (ref 0.44–1.00)
GFR, Estimated: >60 mL/min (ref >60)
Glucose, Bld: 115 mg/dL — ABNORMAL HIGH (ref 70–99)
Potassium: 3.6 mmol/L (ref 3.5–5.1)
Sodium: 136 mmol/L (ref 135–145)
Total Bilirubin: 0.9 mg/dL (ref 0.3–1.2)
Total Protein: 7.3 g/dL (ref 6.5–8.1)

## 2021-06-11 LAB — CBC WITH DIFFERENTIAL/PLATELET
Abs Immature Granulocytes: 0.02 10*3/uL (ref 0.00–0.07)
Basophils Absolute: 0 10*3/uL (ref 0.0–0.1)
Basophils Relative: 0 %
Eosinophils Absolute: 0.2 10*3/uL (ref 0.0–0.5)
Eosinophils Relative: 2 %
HCT: 40.2 % (ref 36.0–46.0)
Hemoglobin: 13.5 g/dL (ref 12.0–15.0)
Immature Granulocytes: 0 %
Lymphocytes Relative: 34 %
Lymphs Abs: 3.2 10*3/uL (ref 0.7–4.0)
MCH: 29.2 pg (ref 26.0–34.0)
MCHC: 33.6 g/dL (ref 30.0–36.0)
MCV: 87 fL (ref 80.0–100.0)
Monocytes Absolute: 0.4 10*3/uL (ref 0.1–1.0)
Monocytes Relative: 4 %
Neutro Abs: 5.5 10*3/uL (ref 1.7–7.7)
Neutrophils Relative %: 60 %
Platelets: 272 10*3/uL (ref 150–400)
RBC: 4.62 MIL/uL (ref 3.87–5.11)
RDW: 12.2 % (ref 11.5–15.5)
WBC: 9.5 10*3/uL (ref 4.0–10.5)
nRBC: 0 % (ref 0.0–0.2)

## 2021-06-11 LAB — TYPE AND SCREEN
ABO/RH(D): A POS
Antibody Screen: NEGATIVE

## 2021-06-11 LAB — ABO/RH: ABO/RH(D): A POS

## 2021-06-11 LAB — POC OCCULT BLOOD, ED: Fecal Occult Bld: NEGATIVE

## 2021-06-11 LAB — LIPASE, BLOOD: Lipase: 29 U/L (ref 11–51)

## 2021-06-11 LAB — I-STAT BETA HCG BLOOD, ED (MC, WL, AP ONLY): I-stat hCG, quantitative: <5 m[IU]/mL (ref <5)

## 2021-06-11 MED ORDER — SODIUM CHLORIDE 0.9 % IV SOLN: 1000.0000 mL | INTRAVENOUS | Status: DC

## 2021-06-11 MED ORDER — SODIUM CHLORIDE 0.9 % IV BOLUS (SEPSIS): 1000.0000 mL | Freq: Once | INTRAVENOUS | Status: DC

## 2021-06-11 MED ORDER — PROCHLORPERAZINE EDISYLATE 10 MG/2ML IJ SOLN
10.0000 mg | Freq: Once | INTRAMUSCULAR | Status: DC
  Filled 2021-06-11: qty 2

## 2021-06-11 MED ORDER — DICYCLOMINE HCL 20 MG PO TABS: 20.0000 mg | ORAL_TABLET | Freq: Two times a day (BID) | ORAL | 0 refills | Status: DC

## 2021-06-11 MED ORDER — ONDANSETRON 8 MG PO TBDP: 8.0000 mg | ORAL_TABLET | Freq: Three times a day (TID) | ORAL | 0 refills | Status: DC | PRN

## 2021-06-11 MED ORDER — FAMOTIDINE 20 MG PO TABS: 20.0000 mg | ORAL_TABLET | Freq: Two times a day (BID) | ORAL | 0 refills | Status: DC

## 2021-06-11 NOTE — ED Triage Notes (Signed)
Pt here from home d/t abdominal pain that started today. Patient states that abdominal pain that radiates on the epigastric, LLQ/RLQ area. Pt states that she had difficulty having bowel movements and when she was able to do so, pt noted her stool was black in color.

## 2021-06-11 NOTE — ED Provider Notes (Signed)
[facility] EMERGENCY DEPARTMENT Provider Note   CSN: [id number] Arrival date & time: 06/11/21  0304     History Chief Complaint  Patient presents with   Abdominal Pain    Samantha Morse is a 20 y.o. female.   Abdominal Pain  Patient presents to the ED for evaluation of abdominal pain.  Patient states she started having symptoms yesterday.  She had diffuse abdominal cramping while she was at work.  The symptoms intensified made it difficult for her to continue.  She felt the portion of the bathroom but did not notice diarrhea last dose.  Patient also noticed that her stool was dark brown in color.  She denied any blood in her stool.  She denied any vomiting or hematemesis patient denies any dysuria.  No vaginal discharge or bleeding  Past Medical History:  Diagnosis Date   Asthma    prn inhaler   Complication of anesthesia 04/27/2015   vomiting and laryngospasm on extubation, requiring CPAP, jaw thrust and succinylcholine   Hidradenitis axillaris 07/2015   right   Irregular periods    Hx - now on OCP daily   Obesity    Pollen allergy    PONV (postoperative nausea and vomiting)     Patient Active Problem List   Diagnosis Date Noted   Migraine without aura and without status migrainosus, not intractable 01/07/2021   Cervical myofascial pain syndrome 01/07/2021   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   Peritonsillar abscess 02/18/2019   Obesity 06/28/2016   Hidradenitis suppurativa 06/28/2016   Amenorrhea 06/28/2016    Past Surgical History:  Procedure Laterality Date   HYDRADENITIS EXCISION Left 04/27/2015   Procedure: EXCISION HIDRADENITIS LEFT AXILLA [name] CLOSURE ;  Surgeon: Louisa Second, MD;  Location: [facility];  Service: Plastics;  Laterality: Left;   HYDRADENITIS EXCISION Right 08/10/2015   Procedure: EXCISION HIDRADENITIS RIGHT AXILLA, RYAN POLLOCK CLOSURE;  Surgeon: Louisa Second, MD;  Location: [facility];  Service: Plastics;  Laterality: Right;   TONSILLECTOMY Bilateral 03/15/2019   Procedure: TONSILLECTOMY;  Surgeon: Graylin Shiver, MD;  Location: [facility];  Service: ENT;  Laterality: Bilateral;     OB History     Gravida  0   Para  0   Term  0   Preterm  0   AB  0   Living  0      SAB  0   IAB  0   Ectopic  0   Multiple  0   Live Births  0           Family History  Problem Relation Age of Onset   Hypertension Maternal Grandmother    Kidney cancer Maternal Grandmother    Hypertension Mother    Heart disease Mother        MI   Pulmonary embolism Mother    Thyroid disease Mother    Hernia Mother    Diabetes Maternal Uncle    Diabetes Maternal Grandfather    Hypertension Maternal Grandfather    Cirrhosis Maternal Aunt     Social History   Tobacco Use   Smoking status: Former    Types: Cigars   Smokeless tobacco: Never   Tobacco comments:    Black and Mild Cigars, 2 cigars/week  Vaping Use   Vaping Use: Never used  Substance Use Topics   Alcohol use: Yes    Comment: socially   Drug use: Yes    Types: Marijuana  Comment: 01/07/21 1-2 x daily    Home Medications Prior to Admission medications   Medication Sig Start Date End Date Taking? Authorizing Provider  acetaminophen (TYLENOL) 325 MG tablet Take 2 tablets (650 mg total) by mouth every 6 (six) hours. Patient taking differently: Take 650 mg by mouth every 4 (four) hours as needed for headache. 03/15/19  Yes Marcellino, Glee Arvin, MD  albuterol (PROVENTIL HFA;VENTOLIN HFA) 108 (90 Base) MCG/ACT inhaler Inhale 1-2 puffs into the lungs every 6 (six) hours as needed for wheezing or shortness of breath. 12/03/18  Yes Rancour, Jeannett Senior, MD  dicyclomine (BENTYL) 20 MG tablet Take 1 tablet (20 mg total) by mouth 2 (two) times daily. 06/11/21  Yes Linwood Dibbles, MD  famotidine (PEPCID) 20 MG tablet Take 1 tablet (20 mg total) by mouth 2 (two) times daily. 06/11/21  Yes Linwood Dibbles, MD  gabapentin  (NEURONTIN) 300 MG capsule Take 1 capsule (300 mg total) by mouth 3 (three) times daily. 01/07/21  Yes Anson Fret, MD  ibuprofen (ADVIL) 800 MG tablet Take 1 tablet (800 mg total) by mouth every 8 (eight) hours as needed for moderate pain. 04/24/20  Yes Moshe Cipro, NP  loratadine (CLARITIN) 10 MG tablet Take 10 mg by mouth daily as needed for allergies.  05/23/18  Yes [provider]  methocarbamol (ROBAXIN) 500 MG tablet Take 1 tablet (500 mg total) by mouth 3 (three) times daily. 01/07/21  Yes Anson Fret, MD  ondansetron (ZOFRAN ODT) 8 MG disintegrating tablet Take 1 tablet (8 mg total) by mouth every 8 (eight) hours as needed for nausea or vomiting. 06/11/21  Yes Linwood Dibbles, MD  rizatriptan (MAXALT-MLT) 10 MG disintegrating tablet Take 1 tablet (10 mg total) by mouth as needed for migraine. May repeat in 2 hours if needed 01/07/21  Yes Anson Fret, MD  cyclobenzaprine (FLEXERIL) 10 MG tablet Take 1 tablet (10 mg total) by mouth 2 (two) times daily as needed for muscle spasms. Patient not taking: Reported on 06/11/2021 04/24/20   Moshe Cipro, NP  naproxen (NAPROSYN) 500 MG tablet Take 1 tablet (500 mg total) by mouth 2 (two) times daily. Patient not taking: Reported on 06/11/2021 11/02/20   Kugler, [place], MD    Allergies    Bee pollen and Pollen extract  Review of Systems   Review of Systems  Gastrointestinal:  Positive for abdominal pain.  All other systems reviewed and are negative.  Physical Exam Updated Vital Signs BP (!) 148/11 (BP Location: Right Wrist)   Pulse 80   Temp 98.7 F (37.1 C) (Oral)   Resp 18   Ht 1.626 m (5\' 4" )   Wt (!) 140.6 kg   SpO2 100%   BMI 53.21 kg/m   Physical Exam Vitals and nursing note reviewed.  Constitutional:      General: She is not in acute distress.    Appearance: She is well-developed.  HENT:     Head: Normocephalic and atraumatic.     Right Ear: External ear normal.     Left Ear: External ear normal.   Eyes:     General: No scleral icterus.       Right eye: No discharge.        Left eye: No discharge.     Conjunctiva/sclera: Conjunctivae normal.  Neck:     Trachea: No tracheal deviation.  Cardiovascular:     Rate and Rhythm: Normal rate and regular rhythm.  Pulmonary:     Effort: Pulmonary effort is normal.  No respiratory distress.     Breath sounds: Normal breath sounds. No stridor. No wheezing or rales.  Abdominal:     General: Bowel sounds are normal. There is no distension.     Palpations: Abdomen is soft.     Tenderness: There is no abdominal tenderness. There is no guarding or rebound.  Musculoskeletal:        General: No tenderness or deformity.     Cervical back: Neck supple.  Skin:    General: Skin is warm and dry.     Findings: No rash.  Neurological:     General: No focal deficit present.     Mental Status: She is alert.     Cranial Nerves: No cranial nerve deficit (no facial droop, extraocular movements intact, no slurred speech).     Sensory: No sensory deficit.     Motor: No abnormal muscle tone or seizure activity.     Coordination: Coordination normal.  Psychiatric:        Mood and Affect: Mood normal.    ED Results / Procedures / Treatments   Labs (all labs ordered are listed, but only abnormal results are displayed) Labs Reviewed  COMPREHENSIVE METABOLIC PANEL - Abnormal; Notable for the following components:      Result Value   Glucose, Bld 115 (*)    BUN <5 (*)    All other components within normal limits  CBC WITH DIFFERENTIAL/PLATELET  LIPASE, BLOOD  POC OCCULT BLOOD, ED  I-STAT BETA HCG BLOOD, ED ([facility], [facility], [facility] ONLY)  TYPE AND SCREEN  ABO/RH    EKG None  Radiology No results found.  Procedures Procedures   Medications Ordered in ED Medications  sodium chloride 0.9 % bolus 1,000 mL (has no administration in time range)    Followed by  0.9 %  sodium chloride infusion (has no administration in time range)  prochlorperazine  (COMPAZINE) injection 10 mg (has no administration in time range)    ED Course  I have reviewed the triage vital signs and the nursing notes.  Pertinent labs & imaging results that were available during my care of the patient were reviewed by me and considered in my medical decision making (see chart for details).  Clinical Course as of 06/11/21 1137  Fri Jun 11, 2021  0916 CBC is normal.  Metabolic panel is normal. [JK]    Clinical Course User Index [JK] Linwood Dibbles, MD   MDM Rules/Calculators/A&P                           Patient presented with abdominal pain she was having cramping earlier and dark stools.  Concerns for possible colitis, GI bleeding, diverticulitis.  Exam is reassuring.  No focal tenderness.  Laboratory tests are unremarkable.  Patient's Hemoccult test was negative.  I doubt significant GI bleeding, colitis diverticulitis appendicitis at this time.  Is not having any urinary symptoms.  Doubt renal colic.  This time she is feeling better.  Will discharge home with precautions discussed Final Clinical Impression(s) / ED Diagnoses Final diagnoses:  Generalized abdominal pain    Rx / DC Orders ED Discharge Orders          Ordered    ondansetron (ZOFRAN ODT) 8 MG disintegrating tablet  Every 8 hours PRN        06/11/21 1135    dicyclomine (BENTYL) 20 MG tablet  2 times daily        06/11/21 1135  famotidine (PEPCID) 20 MG tablet  2 times daily        06/11/21 1135             Linwood Dibbles, MD 06/11/21 1137

## 2021-06-11 NOTE — ED Provider Notes (Signed)
Emergency Medicine Provider Triage Evaluation Note  Samantha Morse , a 20 y.o. female  was evaluated in triage.  Pt complains of abdominal pain and bloody stools.  States started around 5PM, stools black/tarry.  She denies seeing any frank red blood.  No hx of GI bleeding in the past.  Does have some mild upper abdominal pain.  No fever/chills.  No heavy NSAID use.  Not on thinners.  Review of Systems  Positive: Bloody stools, abdominal pain Negative: fever  Physical Exam  BP (!) 165/110 (BP Location: Left Arm)   Pulse 79   Temp 98.5 F (36.9 C)   Resp 17   SpO2 100%  Gen:   Awake, no distress   Resp:  Normal effort  MSK:   Moves extremities without difficulty  Other:    Medical Decision Making  Medically screening exam initiated at 3:19 AM.  Appropriate orders placed.  Samantha Morse was informed that the remainder of the evaluation will be completed by another provider, this initial triage assessment does not replace that evaluation, and the importance of remaining in the ED until their evaluation is complete.  Upper abdominal pain and bloody stools.  Denies anticoagulants.  No heavy NSAID use.  Labs sent along with type and screen.   Garlon Hatchet, PA-C 06/11/21 1884    Geoffery Lyons, MD 06/11/21 [phone]

## 2021-06-11 NOTE — ED Notes (Signed)
Pt ambulated to the bathroom on her own power, gait even and steady 

## 2021-06-11 NOTE — Discharge Instructions (Addendum)
Take the medications as prescribed.  Follow-up with a primary care doctor if the symptoms persist.  Return to the ER as needed for worsening symptoms

## 2021-06-14 DIAGNOSIS — R1031 Right lower quadrant pain: Secondary | ICD-10-CM | POA: Diagnosis not present

## 2021-06-15 ENCOUNTER — Observation Stay (HOSPITAL_COMMUNITY)
Admission: EM | Admit: 2021-06-15 | Discharge: 2021-06-16 | Disposition: A | Discharge status: Home / Self Care | Payer: Medicaid Other | Attending: Surgery | Admitting: Surgery

## 2021-06-15 ENCOUNTER — Encounter (HOSPITAL_COMMUNITY): Payer: Self-pay

## 2021-06-15 ENCOUNTER — Other Ambulatory Visit: Payer: Self-pay

## 2021-06-15 DIAGNOSIS — Z79899 Other long term (current) drug therapy: Secondary | ICD-10-CM | POA: Diagnosis not present

## 2021-06-15 DIAGNOSIS — Z20822 Contact with and (suspected) exposure to covid-19: Secondary | ICD-10-CM | POA: Insufficient documentation

## 2021-06-15 DIAGNOSIS — R1031 Right lower quadrant pain: Secondary | ICD-10-CM | POA: Diagnosis not present

## 2021-06-15 DIAGNOSIS — R109 Unspecified abdominal pain: Secondary | ICD-10-CM | POA: Diagnosis not present

## 2021-06-15 DIAGNOSIS — Z87891 Personal history of nicotine dependence: Secondary | ICD-10-CM | POA: Insufficient documentation

## 2021-06-15 DIAGNOSIS — K353 Acute appendicitis with localized peritonitis, without perforation or gangrene: Secondary | ICD-10-CM | POA: Diagnosis not present

## 2021-06-15 DIAGNOSIS — R1084 Generalized abdominal pain: Secondary | ICD-10-CM | POA: Diagnosis not present

## 2021-06-15 DIAGNOSIS — R933 Abnormal findings on diagnostic imaging of other parts of digestive tract: Secondary | ICD-10-CM | POA: Diagnosis not present

## 2021-06-15 DIAGNOSIS — J45909 Unspecified asthma, uncomplicated: Secondary | ICD-10-CM | POA: Insufficient documentation

## 2021-06-15 DIAGNOSIS — K3580 Unspecified acute appendicitis: Secondary | ICD-10-CM | POA: Diagnosis present

## 2021-06-15 LAB — CBC WITH DIFFERENTIAL/PLATELET
Abs Immature Granulocytes: 0.03 10*3/uL (ref 0.00–0.07)
Basophils Absolute: 0 10*3/uL (ref 0.0–0.1)
Basophils Relative: 0 %
Eosinophils Absolute: 0.3 10*3/uL (ref 0.0–0.5)
Eosinophils Relative: 4 %
HCT: 39.2 % (ref 36.0–46.0)
Hemoglobin: 13 g/dL (ref 12.0–15.0)
Immature Granulocytes: 0 %
Lymphocytes Relative: 35 %
Lymphs Abs: 2.8 10*3/uL (ref 0.7–4.0)
MCH: 28.7 pg (ref 26.0–34.0)
MCHC: 33.2 g/dL (ref 30.0–36.0)
MCV: 86.5 fL (ref 80.0–100.0)
Monocytes Absolute: 0.4 10*3/uL (ref 0.1–1.0)
Monocytes Relative: 5 %
Neutro Abs: 4.4 10*3/uL (ref 1.7–7.7)
Neutrophils Relative %: 56 %
Platelets: 322 10*3/uL (ref 150–400)
RBC: 4.53 MIL/uL (ref 3.87–5.11)
RDW: 11.9 % (ref 11.5–15.5)
WBC: 7.9 10*3/uL (ref 4.0–10.5)
nRBC: 0 % (ref 0.0–0.2)

## 2021-06-15 LAB — BASIC METABOLIC PANEL
Anion gap: 8 (ref 5–15)
BUN: 5 mg/dL — ABNORMAL LOW (ref 6–20)
CO2: 24 mmol/L (ref 22–32)
Calcium: 8.7 mg/dL — ABNORMAL LOW (ref 8.9–10.3)
Chloride: 103 mmol/L (ref 98–111)
Creatinine, Ser: 0.59 mg/dL (ref 0.44–1.00)
GFR, Estimated: >60 mL/min (ref >60)
Glucose, Bld: 77 mg/dL (ref 70–99)
Potassium: 3.7 mmol/L (ref 3.5–5.1)
Sodium: 135 mmol/L (ref 135–145)

## 2021-06-15 LAB — RESP PANEL BY RT-PCR (FLU A&B, COVID) ARPGX2
Influenza A by PCR: NEGATIVE
Influenza B by PCR: NEGATIVE
SARS Coronavirus 2 by RT PCR: NEGATIVE

## 2021-06-15 LAB — CBC
HCT: 38.1 % (ref 36.0–46.0)
Hemoglobin: 12.8 g/dL (ref 12.0–15.0)
MCH: 29.1 pg (ref 26.0–34.0)
MCHC: 33.6 g/dL (ref 30.0–36.0)
MCV: 86.6 fL (ref 80.0–100.0)
Platelets: 319 10*3/uL (ref 150–400)
RBC: 4.4 MIL/uL (ref 3.87–5.11)
RDW: 11.8 % (ref 11.5–15.5)
WBC: 7.4 10*3/uL (ref 4.0–10.5)
nRBC: 0 % (ref 0.0–0.2)

## 2021-06-15 LAB — I-STAT BETA HCG BLOOD, ED (MC, WL, AP ONLY): I-stat hCG, quantitative: <5 m[IU]/mL (ref <5)

## 2021-06-15 MED ORDER — ONDANSETRON 4 MG PO TBDP: 4.0000 mg | ORAL_TABLET | Freq: Four times a day (QID) | ORAL | Status: DC | PRN

## 2021-06-15 MED ORDER — ACETAMINOPHEN 325 MG PO TABS: 650.0000 mg | ORAL_TABLET | Freq: Four times a day (QID) | ORAL | Status: DC | PRN

## 2021-06-15 MED ORDER — ACETAMINOPHEN 650 MG RE SUPP: 650.0000 mg | Freq: Four times a day (QID) | RECTAL | Status: DC | PRN

## 2021-06-15 MED ORDER — SODIUM CHLORIDE 0.9 % IV SOLN
2.0000 g | INTRAVENOUS | Status: DC
  Filled 2021-06-15: qty 20

## 2021-06-15 MED ORDER — ENOXAPARIN SODIUM 40 MG/0.4ML IJ SOSY: 40.0000 mg | PREFILLED_SYRINGE | INTRAMUSCULAR | Status: DC

## 2021-06-15 MED ORDER — MORPHINE SULFATE (PF) 2 MG/ML IV SOLN: 2.0000 mg | INTRAVENOUS | Status: DC | PRN

## 2021-06-15 MED ORDER — POLYETHYLENE GLYCOL 3350 17 G PO PACK: 17.0000 g | PACK | Freq: Every day | ORAL | Status: DC | PRN

## 2021-06-15 MED ORDER — METOPROLOL TARTRATE 5 MG/5ML IV SOLN: 5.0000 mg | Freq: Four times a day (QID) | INTRAVENOUS | Status: DC | PRN

## 2021-06-15 MED ORDER — MORPHINE SULFATE (PF) 4 MG/ML IV SOLN
4.0000 mg | Freq: Once | INTRAVENOUS | Status: DC
  Filled 2021-06-15: qty 1

## 2021-06-15 MED ORDER — OXYCODONE HCL 5 MG PO TABS: 5.0000 mg | ORAL_TABLET | Freq: Four times a day (QID) | ORAL | Status: DC | PRN

## 2021-06-15 MED ORDER — KCL IN DEXTROSE-NACL 20-5-0.45 MEQ/L-%-% IV SOLN
INTRAVENOUS | Status: DC
  Filled 2021-06-15: qty 1000

## 2021-06-15 MED ORDER — ONDANSETRON HCL 4 MG/2ML IJ SOLN: 4.0000 mg | Freq: Four times a day (QID) | INTRAMUSCULAR | Status: DC | PRN

## 2021-06-15 MED ORDER — SODIUM CHLORIDE 0.9 % IV SOLN
2.0000 g | Freq: Once | INTRAVENOUS | Status: AC
  Administered 2021-06-15: 2 g via INTRAVENOUS
  Filled 2021-06-15: qty 20

## 2021-06-15 MED ORDER — METRONIDAZOLE 500 MG/100ML IV SOLN
500.0000 mg | Freq: Two times a day (BID) | INTRAVENOUS | Status: DC
  Administered 2021-06-15: 500 mg via INTRAVENOUS

## 2021-06-15 MED ORDER — SODIUM CHLORIDE 0.9 % IV BOLUS
500.0000 mL | Freq: Once | INTRAVENOUS | Status: AC
  Administered 2021-06-15: 500 mL via INTRAVENOUS

## 2021-06-15 MED ORDER — METRONIDAZOLE 500 MG/100ML IV SOLN
500.0000 mg | Freq: Once | INTRAVENOUS | Status: DC
  Filled 2021-06-15: qty 100

## 2021-06-15 NOTE — ED Notes (Signed)
Pt extremely anxious and wanting answers about surgery and plan of care. MD notified and no information available about this at this time. Pt denies pain and does not want Morphine at this time. No acute changes noted. Will continue to monitor.

## 2021-06-15 NOTE — ED Provider Notes (Signed)
[facility] EMERGENCY DEPARTMENT Provider Note   CSN: [id number] Arrival date & time: 06/15/21  1749     History Chief Complaint  Patient presents with   Abdominal Pain    Samantha Morse is a 20 y.o. female presenting for evaluation of abdominal pain.  Patient states for the past 6 days she has been having abdominal pain.  Initially is mid abdominal, now it is more in the right lower quadrant.  She had an outpatient CT done yesterday which showed an appendicitis, instructed to come to the ER today.  She states currently her pain is minimal, 3 out of 10.  She has been taking famotidine and dicyclomine for this.  She denies fevers, nausea, vomiting, urinary symptoms, normal bowel movements.  She has no other medical problems.  Takes no medications daily.  Pain is worse with movement.   HPI     Past Medical History:  Diagnosis Date   Asthma    prn inhaler   Complication of anesthesia 04/27/2015   vomiting and laryngospasm on extubation, requiring CPAP, jaw thrust and succinylcholine   Hidradenitis axillaris 07/2015   right   Irregular periods    Hx - now on OCP daily   Obesity    Pollen allergy    PONV (postoperative nausea and vomiting)     Patient Active Problem List   Diagnosis Date Noted   Migraine without aura and without status migrainosus, not intractable 01/07/2021   Cervical myofascial pain syndrome 01/07/2021   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   Peritonsillar abscess 02/18/2019   Obesity 06/28/2016   Hidradenitis suppurativa 06/28/2016   Amenorrhea 06/28/2016    Past Surgical History:  Procedure Laterality Date   HYDRADENITIS EXCISION Left 04/27/2015   Procedure: EXCISION HIDRADENITIS LEFT AXILLA [name] CLOSURE ;  Surgeon: Louisa Second, MD;  Location: [facility];  Service: Plastics;  Laterality: Left;   HYDRADENITIS EXCISION Right 08/10/2015   Procedure: EXCISION HIDRADENITIS RIGHT AXILLA, RYAN POLLOCK CLOSURE;   Surgeon: Louisa Second, MD;  Location: [facility];  Service: Plastics;  Laterality: Right;   TONSILLECTOMY Bilateral 03/15/2019   Procedure: TONSILLECTOMY;  Surgeon: Graylin Shiver, MD;  Location: [facility];  Service: ENT;  Laterality: Bilateral;     OB History     Gravida  0   Para  0   Term  0   Preterm  0   AB  0   Living  0      SAB  0   IAB  0   Ectopic  0   Multiple  0   Live Births  0           Family History  Problem Relation Age of Onset   Hypertension Maternal Grandmother    Kidney cancer Maternal Grandmother    Hypertension Mother    Heart disease Mother        MI   Pulmonary embolism Mother    Thyroid disease Mother    Hernia Mother    Diabetes Maternal Uncle    Diabetes Maternal Grandfather    Hypertension Maternal Grandfather    Cirrhosis Maternal Aunt     Social History   Tobacco Use   Smoking status: Former    Types: Cigars   Smokeless tobacco: Never   Tobacco comments:    Black and Mild Cigars, 2 cigars/week  Vaping Use   Vaping Use: Never used  Substance Use Topics   Alcohol use: Yes  Comment: socially   Drug use: Yes    Types: Marijuana    Comment: 01/07/21 1-2 x daily    Home Medications Prior to Admission medications   Medication Sig Start Date End Date Taking? Authorizing Provider  acetaminophen (TYLENOL) 325 MG tablet Take 2 tablets (650 mg total) by mouth every 6 (six) hours. Patient taking differently: Take 325-650 mg by mouth See admin instructions. Take 325-650 mg by mouth every 4-6 hours as needed for pain or headaches 03/15/19  Yes Marcellino, Glee Arvin, MD  albuterol (PROVENTIL HFA;VENTOLIN HFA) 108 (90 Base) MCG/ACT inhaler Inhale 1-2 puffs into the lungs every 6 (six) hours as needed for wheezing or shortness of breath. 12/03/18  Yes Rancour, Jeannett Senior, MD  dicyclomine (BENTYL) 20 MG tablet Take 1 tablet (20 mg total) by mouth 2 (two) times daily. 06/11/21  Yes Linwood Dibbles, MD  famotidine  (PEPCID) 20 MG tablet Take 1 tablet (20 mg total) by mouth 2 (two) times daily. 06/11/21  Yes Linwood Dibbles, MD  gabapentin (NEURONTIN) 300 MG capsule Take 1 capsule (300 mg total) by mouth 3 (three) times daily. Patient taking differently: Take 300 mg by mouth 3 (three) times daily as needed (as directed). 01/07/21  Yes Anson Fret, MD  methocarbamol (ROBAXIN) 500 MG tablet Take 1 tablet (500 mg total) by mouth 3 (three) times daily. Patient taking differently: Take 500 mg by mouth 3 (three) times daily as needed for muscle spasms. 01/07/21  Yes Anson Fret, MD  rizatriptan (MAXALT-MLT) 10 MG disintegrating tablet Take 1 tablet (10 mg total) by mouth as needed for migraine. May repeat in 2 hours if needed Patient taking differently: Take 10 mg by mouth as needed for migraine (as directed and may repeat once in 2 hours, if no relief (dissolve orally)). 01/07/21  Yes Anson Fret, MD  cyclobenzaprine (FLEXERIL) 10 MG tablet Take 1 tablet (10 mg total) by mouth 2 (two) times daily as needed for muscle spasms. Patient not taking: Reported on 06/15/2021 04/24/20   Moshe Cipro, NP  ibuprofen (ADVIL) 800 MG tablet Take 1 tablet (800 mg total) by mouth every 8 (eight) hours as needed for moderate pain. Patient not taking: Reported on 06/15/2021 04/24/20   Moshe Cipro, NP  naproxen (NAPROSYN) 500 MG tablet Take 1 tablet (500 mg total) by mouth 2 (two) times daily. Patient not taking: Reported on 06/15/2021 11/02/20   Kugler, [place], MD  ondansetron (ZOFRAN ODT) 8 MG disintegrating tablet Take 1 tablet (8 mg total) by mouth every 8 (eight) hours as needed for nausea or vomiting. Patient not taking: Reported on 06/15/2021 06/11/21   Linwood Dibbles, MD    Allergies    Bee venom, Bee pollen, and Pollen extract  Review of Systems   Review of Systems  Gastrointestinal:  Positive for abdominal pain.  All other systems reviewed and are negative.  Physical Exam Updated Vital Signs BP (!)  142/89 (BP Location: Right Arm)   Pulse 79   Temp 98.4 F (36.9 C) (Oral)   Resp 20   LMP  (LMP Unknown)   SpO2 100%   Physical Exam Vitals and nursing note reviewed.  Constitutional:      General: She is not in acute distress.    Appearance: Normal appearance. She is obese.  HENT:     Head: Normocephalic and atraumatic.  Eyes:     Conjunctiva/sclera: Conjunctivae normal.     Pupils: Pupils are equal, round, and reactive to light.  Cardiovascular:  Rate and Rhythm: Normal rate and regular rhythm.     Pulses: Normal pulses.  Pulmonary:     Effort: Pulmonary effort is normal. No respiratory distress.     Breath sounds: Normal breath sounds. No wheezing.     Comments: Speaking in full sentences.  Clear lung sounds in all fields. Abdominal:     General: There is no distension.     Palpations: Abdomen is soft. There is no mass.     Tenderness: There is abdominal tenderness in the right lower quadrant. There is no guarding or rebound.     Comments: Ttp of RLQ abd. No peritonitis  Musculoskeletal:        General: Normal range of motion.     Cervical back: Normal range of motion and neck supple.  Skin:    General: Skin is warm and dry.     Capillary Refill: Capillary refill takes less than 2 seconds.  Neurological:     Mental Status: She is alert and oriented to person, place, and time.  Psychiatric:        Mood and Affect: Mood and affect normal.        Speech: Speech normal.        Behavior: Behavior normal.    ED Results / Procedures / Treatments   Labs (all labs ordered are listed, but only abnormal results are displayed) Labs Reviewed  RESP PANEL BY RT-PCR (FLU A&B, COVID) ARPGX2  CBC WITH DIFFERENTIAL/PLATELET  BASIC METABOLIC PANEL  I-STAT BETA HCG BLOOD, ED ([facility], [facility], [facility] ONLY)    EKG None  Radiology No results found.  Procedures Procedures   Medications Ordered in ED Medications  cefTRIAXone (ROCEPHIN) 2 g in sodium chloride 0.9 % 100 mL IVPB (has no  administration in time range)    And  metroNIDAZOLE (FLAGYL) IVPB 500 mg (has no administration in time range)  morphine 4 MG/ML injection 4 mg (4 mg Intravenous Not Given 06/15/21 2104)  sodium chloride 0.9 % bolus 500 mL (500 mLs Intravenous New Bag/Given 06/15/21 2104)    ED Course  I have reviewed the triage vital signs and the nursing notes.  Pertinent labs & imaging results that were available during my care of the patient were reviewed by me and considered in my medical decision making (see chart for details).    MDM Rules/Calculators/A&P                           Patient presenting for evaluation of abdominal pain.  On exam, patient appears nontoxic.  She does have tenderness palpation of the right lower quadrant abdomen.  No peritonitis.  Vital signs are reassuring, no evidence of sepsis.  No leukocytosis.  We will add on metabolic panel, pregnancy test.  CT from outside location available for read, shows acute appendicitis without perforation.  Will start antibiotics and consult general surgery.  Discussed with Dr. Sheliah Hatch from general surgery, they will evaluate the pt.   Final Clinical Impression(s) / ED Diagnoses Final diagnoses:  Acute appendicitis with localized peritonitis, without perforation, abscess, or gangrene    Rx / DC Orders ED Discharge Orders     None        Alveria Apley, PA-C 06/15/21 2107    Ernie Avena, MD 06/15/21 2219

## 2021-06-15 NOTE — ED Notes (Signed)
Pt sitting up on side of the bed, eating food that family brought. Pt denies any complaints. No acute changes noted. Will continue to monitor.

## 2021-06-15 NOTE — ED Notes (Signed)
Surgeon at bedside.  

## 2021-06-15 NOTE — ED Notes (Addendum)
Pt ambulatory to the bathroom to collect urine specimen and back to room without difficulty. No acute changes noted. Will continue to monitor.

## 2021-06-15 NOTE — ED Provider Notes (Signed)
Emergency Medicine Provider Triage Evaluation Note  Samantha Morse , a 20 y.o. female  was evaluated in triage.  Pt sent from outside clinic due to appendicitis.  Reports she was having on and off pain this weekend.  Review of Systems  Positive: Abdominal pain Negative: Nausea, vomiting, diarrhea, fever or chills  Physical Exam  BP (!) 152/109 (BP Location: Right Arm)   Pulse 79   Temp 98.4 F (36.9 C) (Oral)   Resp 18   SpO2 98%  Gen:   Awake, no distress   Resp:  Normal effort  MSK:   Moves extremities without difficulty  Other:    Medical Decision Making  Medically screening exam initiated at 6:00 PM.  Appropriate orders placed.  CHESNEY SUARES was informed that the remainder of the evaluation will be completed by another provider, this initial triage assessment does not replace that evaluation, and the importance of remaining in the ED until their evaluation is complete.  CT from [facility]:   IMPRESSION:  1. Acute appendicitis without evidence for rupture or abscess  formation. Appendix is markedly enlarged at 12 mm with diffuse  inflammatory change  2. Secondary inflammatory changes at the terminal ileum.  3. Mild fatty infiltration of the liver.    Saddie Benders, PA-C 06/15/21 1801    Gerhard Munch, MD 06/15/21 2156

## 2021-06-15 NOTE — H&P (Signed)
**Note Samantha-Identified via Obfuscation** Reason for Consult:abdominal pain Referring Provider: Alveria Morse  Samantha Morse is an 20 y.o. female.  HPI: 20 yo female who had pain 2 days ago underwent CT scan today and then was told to go to ER for concern of appendicitis. Currently she is not complaining of abdominal pain. She never had nausea or vomiting. She has had normal bowel movements and has been eating well today.  Past Medical History:  Diagnosis Date   Asthma    prn inhaler   Complication of anesthesia 04/27/2015   vomiting and laryngospasm on extubation, requiring CPAP, jaw thrust and succinylcholine   Hidradenitis axillaris 07/2015   right   Irregular periods    Hx - now on OCP daily   Obesity    Pollen allergy    PONV (postoperative nausea and vomiting)     Past Surgical History:  Procedure Laterality Date   HYDRADENITIS EXCISION Left 04/27/2015   Procedure: EXCISION HIDRADENITIS LEFT AXILLA Samantha Morse CLOSURE ;  Surgeon: Samantha Second, MD;  Location: [facility];  Service: Plastics;  Laterality: Left;   HYDRADENITIS EXCISION Right 08/10/2015   Procedure: EXCISION HIDRADENITIS RIGHT AXILLA, Samantha POLLOCK CLOSURE;  Surgeon: Samantha Second, MD;  Location: [facility];  Service: Plastics;  Laterality: Right;   TONSILLECTOMY Bilateral 03/15/2019   Procedure: TONSILLECTOMY;  Surgeon: Samantha Shiver, MD;  Location: [facility] OR;  Service: ENT;  Laterality: Bilateral;    Family History  Problem Relation Age of Onset   Hypertension Maternal Grandmother    Kidney cancer Maternal Grandmother    Hypertension Mother    Heart disease Mother        MI   Pulmonary embolism Mother    Thyroid disease Mother    Hernia Mother    Diabetes Maternal Uncle    Diabetes Maternal Grandfather    Hypertension Maternal Grandfather    Cirrhosis Maternal Aunt     Social History:  reports that she has quit smoking. Her smoking use included cigars. She has never used smokeless tobacco. She  reports current alcohol use. She reports current drug use. Drug: Marijuana.  Allergies:  Allergies  Allergen Reactions   Bee Venom Other (See Comments)    Red eyes and a stuffy nose (only, per the patient)   Bee Pollen Other (See Comments)    REDNESS OF EYES, STUFFY NOSE   Pollen Extract Other (See Comments)    REDNESS OF EYES, STUFFY NOSE    Medications: I have reviewed the patient's current medications.  Results for orders placed or performed during the hospital encounter of 06/15/21 (from the past 48 hour(s))  CBC with Differential     Status: None   Collection Time: 06/15/21  6:18 PM  Result Value Ref Range   WBC 7.9 4.0 - 10.5 K/uL   RBC 4.53 3.87 - 5.11 MIL/uL   Hemoglobin 13.0 12.0 - 15.0 g/dL   HCT 87.8 67.6 - 72.0 %   MCV 86.5 80.0 - 100.0 fL   MCH 28.7 26.0 - 34.0 pg   MCHC 33.2 30.0 - 36.0 g/dL   RDW 94.7 09.6 - 28.3 %   Platelets 322 150 - 400 K/uL   nRBC 0.0 0.0 - 0.2 %   Neutrophils Relative % 56 %   Neutro Abs 4.4 1.7 - 7.7 K/uL   Lymphocytes Relative 35 %   Lymphs Abs 2.8 0.7 - 4.0 K/uL   Monocytes Relative 5 %   Monocytes Absolute 0.4 0.1 - 1.0 K/uL  Eosinophils Relative 4 %   Eosinophils Absolute 0.3 0.0 - 0.5 K/uL   Basophils Relative 0 %   Basophils Absolute 0.0 0.0 - 0.1 K/uL   Immature Granulocytes 0 %   Abs Immature Granulocytes 0.03 0.00 - 0.07 K/uL    Comment: Performed at [facility], [address], [place], [place] [address]  I-Stat Beta hCG blood, ED ([facility], [facility], [facility] only)     Status: None   Collection Time: 06/15/21  9:13 PM  Result Value Ref Range   I-stat hCG, quantitative <5.0 <5 mIU/mL   Comment 3            Comment:   GEST. AGE      CONC.  (mIU/mL)   <=1 WEEK        5 - 50     2 WEEKS       50 - 500     3 WEEKS       100 - 10,000     4 WEEKS     1,000 - 30,000        FEMALE AND NON-PREGNANT FEMALE:     LESS THAN 5 mIU/mL     No results found.  Review of Systems  Constitutional: Negative.   HENT: Negative.    Eyes:  Negative.   Respiratory: Negative.    Cardiovascular: Negative.   Gastrointestinal: Negative.   Genitourinary: Negative.   Musculoskeletal: Negative.   Skin: Negative.   Neurological: Negative.   Endo/Heme/Allergies: Negative.   Psychiatric/Behavioral: Negative.     PE Blood pressure (!) 150/91, pulse 82, temperature 98.4 F (36.9 C), temperature source Oral, resp. rate 20, SpO2 100 %. Constitutional: NAD; conversant; no deformities Eyes: Moist conjunctiva; no lid lag; anicteric; PERRL Neck: Trachea midline; no thyromegaly Lungs: Normal respiratory effort; no tactile fremitus CV: RRR; no palpable thrills; no pitting edema GI: Abd soft, slight tenderness in right lower quadrant; no palpable hepatosplenomegaly MSK: Normal gait; no clubbing/cyanosis Psychiatric: Appropriate affect; alert and oriented x3 Lymphatic: No palpable cervical or axillary lymphadenopathy Skin: No major subcutaneous nodules. Warm and dry   Assessment/Plan: 20 yo female with history of abdominal pain, normal white count and CT scan with appendix dilated to 12 mm. CT is on [facility] system and makes no mention to appendicolith. -IV abx -observation -discussed option for surgery and risks benefits of surgery vs antibiotics  Samantha Morse 06/15/2021, 9:33 PM

## 2021-06-15 NOTE — ED Triage Notes (Signed)
Pt reports intermittent abd pain throughout the weekend, seen at [facility] office, outpatient CT done showing acute appendicitis. Denies pain at this time. No n.v

## 2021-06-16 ENCOUNTER — Encounter (HOSPITAL_COMMUNITY): Payer: Self-pay

## 2021-06-16 DIAGNOSIS — R933 Abnormal findings on diagnostic imaging of other parts of digestive tract: Secondary | ICD-10-CM | POA: Diagnosis not present

## 2021-06-16 DIAGNOSIS — R1084 Generalized abdominal pain: Secondary | ICD-10-CM | POA: Diagnosis not present

## 2021-06-16 LAB — COMPREHENSIVE METABOLIC PANEL
ALT: 28 U/L (ref 0–44)
AST: 27 U/L (ref 15–41)
Albumin: 3.1 g/dL — ABNORMAL LOW (ref 3.5–5.0)
Alkaline Phosphatase: 62 U/L (ref 38–126)
Anion gap: 12 (ref 5–15)
BUN: 6 mg/dL (ref 6–20)
CO2: 20 mmol/L — ABNORMAL LOW (ref 22–32)
Calcium: 8.8 mg/dL — ABNORMAL LOW (ref 8.9–10.3)
Chloride: 107 mmol/L (ref 98–111)
Creatinine, Ser: 0.62 mg/dL (ref 0.44–1.00)
GFR, Estimated: >60 mL/min (ref >60)
Glucose, Bld: 98 mg/dL (ref 70–99)
Potassium: 4.6 mmol/L (ref 3.5–5.1)
Sodium: 139 mmol/L (ref 135–145)
Total Bilirubin: 0.7 mg/dL (ref 0.3–1.2)
Total Protein: 6.2 g/dL — ABNORMAL LOW (ref 6.5–8.1)

## 2021-06-16 LAB — CBC
HCT: 35.9 % — ABNORMAL LOW (ref 36.0–46.0)
Hemoglobin: 12.2 g/dL (ref 12.0–15.0)
MCH: 29 pg (ref 26.0–34.0)
MCHC: 34 g/dL (ref 30.0–36.0)
MCV: 85.5 fL (ref 80.0–100.0)
Platelets: 270 10*3/uL (ref 150–400)
RBC: 4.2 MIL/uL (ref 3.87–5.11)
RDW: 11.9 % (ref 11.5–15.5)
WBC: 7.6 10*3/uL (ref 4.0–10.5)
nRBC: 0 % (ref 0.0–0.2)

## 2021-06-16 LAB — HIV ANTIBODY (ROUTINE TESTING W REFLEX): HIV Screen 4th Generation wRfx: NONREACTIVE

## 2021-06-16 MED ORDER — AMOXICILLIN-POT CLAVULANATE 875-125 MG PO TABS: 1.0000 | ORAL_TABLET | Freq: Two times a day (BID) | ORAL | 0 refills | Status: AC

## 2021-06-16 NOTE — Discharge Summary (Signed)
Patient ID: Samantha Morse [id number] 20 y.o. 2000-12-29  06/15/2021  Discharge date and time: 06/16/2021  Admitting Physician: Hyman Hopes Floreen Teegarden  Discharge Physician: Hyman Hopes Chudney Scheffler  Admission Diagnoses: Acute appendicitis [K35.80] Acute appendicitis with localized peritonitis, without perforation, abscess, or gangrene [K35.30] Patient Active Problem List   Diagnosis Date Noted   Acute appendicitis 06/15/2021   Migraine without aura and without status migrainosus, not intractable 01/07/2021   Cervical myofascial pain syndrome 01/07/2021   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   Peritonsillar abscess 02/18/2019   Obesity 06/28/2016   Hidradenitis suppurativa 06/28/2016   Amenorrhea 06/28/2016     Discharge Diagnoses: Appendicitis Patient Active Problem List   Diagnosis Date Noted   Acute appendicitis 06/15/2021   Migraine without aura and without status migrainosus, not intractable 01/07/2021   Cervical myofascial pain syndrome 01/07/2021   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   Peritonsillar abscess 02/18/2019   Obesity 06/28/2016   Hidradenitis suppurativa 06/28/2016   Amenorrhea 06/28/2016    Operations:   Admission Condition: good  Discharged Condition: good  Indication for Admission: Appendicitis  Hospital Course: Ms. Sek presented after being told she has appendicitis on outpatient CT.  She felt abdominal pain last weekend, but has been feeling well and wouldn't have come to the ER if she was not told to.  We discussed the options of surgery vs. antibiotics for treatment of appendicitis.  She would like to try antibiotics.  We discussed an approximate 25% risk of recurrent appendicitis or needing surgery despite antibiotics, but antibiotics have been shown to be a safe option.  She elected to continue antibiotics.  We will send a 14 day prescription of augmentin to her pharmacy.  She can follow up with me in office.   Consults: None  Significant  Diagnostic Studies: CT  Treatments: ATBX - IV and continue with augmentin outpatient  Disposition: Home  Patient Instructions:  Allergies as of 06/16/2021       Reactions   Bee Venom Other (See Comments)   Red eyes and a stuffy nose (only, per the patient)   Bee Pollen Other (See Comments)   REDNESS OF EYES, STUFFY NOSE   Pollen Extract Other (See Comments)   REDNESS OF EYES, STUFFY NOSE        Medication List     TAKE these medications    acetaminophen 325 MG tablet Commonly known as: TYLENOL Take 2 tablets (650 mg total) by mouth every 6 (six) hours. What changed:  how much to take when to take this additional instructions   albuterol 108 (90 Base) MCG/ACT inhaler Commonly known as: VENTOLIN HFA Inhale 1-2 puffs into the lungs every 6 (six) hours as needed for wheezing or shortness of breath.   amoxicillin-clavulanate 875-125 MG tablet Commonly known as: Augmentin Take 1 tablet by mouth 2 (two) times daily for 14 days.   cyclobenzaprine 10 MG tablet Commonly known as: FLEXERIL Take 1 tablet (10 mg total) by mouth 2 (two) times daily as needed for muscle spasms.   dicyclomine 20 MG tablet Commonly known as: BENTYL Take 1 tablet (20 mg total) by mouth 2 (two) times daily.   famotidine 20 MG tablet Commonly known as: Pepcid Take 1 tablet (20 mg total) by mouth 2 (two) times daily.   gabapentin 300 MG capsule Commonly known as: NEURONTIN Take 1 capsule (300 mg total) by mouth 3 (three) times daily. What changed:  when to take this reasons to take this   ibuprofen 800 MG tablet Commonly known  as: ADVIL Take 1 tablet (800 mg total) by mouth every 8 (eight) hours as needed for moderate pain.   methocarbamol 500 MG tablet Commonly known as: ROBAXIN Take 1 tablet (500 mg total) by mouth 3 (three) times daily. What changed:  when to take this reasons to take this   naproxen 500 MG tablet Commonly known as: NAPROSYN Take 1 tablet (500 mg total) by mouth  2 (two) times daily.   ondansetron 8 MG disintegrating tablet Commonly known as: Zofran ODT Take 1 tablet (8 mg total) by mouth every 8 (eight) hours as needed for nausea or vomiting.   rizatriptan 10 MG disintegrating tablet Commonly known as: MAXALT-MLT Take 1 tablet (10 mg total) by mouth as needed for migraine. May repeat in 2 hours if needed What changed:  reasons to take this additional instructions        Activity: activity as tolerated Diet: regular diet Wound Care:  n/a  Follow-up:  With Dr. Dossie Der in 4 weeks.  Signed: Hyman Hopes Kassidee Narciso General, Bariatric, & Minimally Invasive Surgery [facility], [place]   06/16/2021, 8:04 AM

## 2021-06-28 DIAGNOSIS — M79642 Pain in left hand: Secondary | ICD-10-CM | POA: Diagnosis not present

## 2021-06-28 DIAGNOSIS — K36 Other appendicitis: Secondary | ICD-10-CM | POA: Diagnosis not present

## 2021-06-30 DIAGNOSIS — K353 Acute appendicitis with localized peritonitis, without perforation or gangrene: Secondary | ICD-10-CM | POA: Diagnosis not present

## 2021-07-01 ENCOUNTER — Encounter (HOSPITAL_COMMUNITY): Payer: Self-pay | Admitting: Surgery

## 2021-07-01 ENCOUNTER — Other Ambulatory Visit: Payer: Self-pay

## 2021-07-01 ENCOUNTER — Ambulatory Visit: Payer: Self-pay | Admitting: Surgery

## 2021-07-01 NOTE — Progress Notes (Addendum)
For Short Stay: COVID SWAB appointment date:N/A Date of COVID positive in last 90 days: N/A   For Anesthesia: PCP - Iona Hansen, NP Cardiologist - N/A Neurologist- Naomie Dean, MD   Chest x-ray - greater than 1 year EKG - greater than 1 year Stress Test - N/A ECHO - greater than 2 years Cardiac Cath - N/A Pacemaker/ICD device last checked: N/A  Sleep Study - N/A CPAP - N/A  Fasting Blood Sugar - N/A Checks Blood Sugar __N/A___ times a day  Blood Thinner Instructions: N/A Aspirin Instructions:N/A Last Dose:N/A  Activity level: Able to exercise without chest pain and/or shortness of breath    Anesthesia review: N/A  Patient denies shortness of breath, fever, cough and chest pain at PAT appointment   Patient verbalized understanding of instructions that were given to them at the PAT appointment. Patient was also instructed that they will need to review over the PAT instructions again at home before surgery.

## 2021-07-01 NOTE — Progress Notes (Signed)
Sent message, via epic in basket, requesting orders in epic from surgeon.  

## 2021-07-05 ENCOUNTER — Encounter (HOSPITAL_COMMUNITY)
Admission: RE | Disposition: A | Discharge status: Home / Self Care | Payer: Self-pay | Source: Home / Self Care | Attending: Surgery

## 2021-07-05 ENCOUNTER — Encounter (HOSPITAL_COMMUNITY): Payer: Self-pay | Admitting: Surgery

## 2021-07-05 ENCOUNTER — Ambulatory Visit (HOSPITAL_COMMUNITY): Payer: Medicaid Other | Admitting: Certified Registered Nurse Anesthetist

## 2021-07-05 ENCOUNTER — Ambulatory Visit (HOSPITAL_COMMUNITY)
Admission: RE | Admit: 2021-07-05 | Discharge: 2021-07-05 | Disposition: A | Discharge status: Home / Self Care | Payer: Medicaid Other | Attending: Surgery | Admitting: Surgery

## 2021-07-05 DIAGNOSIS — K353 Acute appendicitis with localized peritonitis, without perforation or gangrene: Secondary | ICD-10-CM | POA: Diagnosis not present

## 2021-07-05 DIAGNOSIS — G43009 Migraine without aura, not intractable, without status migrainosus: Secondary | ICD-10-CM | POA: Diagnosis not present

## 2021-07-05 DIAGNOSIS — K219 Gastro-esophageal reflux disease without esophagitis: Secondary | ICD-10-CM | POA: Diagnosis not present

## 2021-07-05 DIAGNOSIS — Z48817 Encounter for surgical aftercare following surgery on the skin and subcutaneous tissue: Secondary | ICD-10-CM | POA: Diagnosis not present

## 2021-07-05 DIAGNOSIS — Z87891 Personal history of nicotine dependence: Secondary | ICD-10-CM | POA: Insufficient documentation

## 2021-07-05 DIAGNOSIS — R109 Unspecified abdominal pain: Secondary | ICD-10-CM | POA: Diagnosis not present

## 2021-07-05 DIAGNOSIS — K3580 Unspecified acute appendicitis: Secondary | ICD-10-CM | POA: Insufficient documentation

## 2021-07-05 DIAGNOSIS — J45909 Unspecified asthma, uncomplicated: Secondary | ICD-10-CM | POA: Insufficient documentation

## 2021-07-05 DIAGNOSIS — Z48815 Encounter for surgical aftercare following surgery on the digestive system: Secondary | ICD-10-CM | POA: Diagnosis not present

## 2021-07-05 DIAGNOSIS — R599 Enlarged lymph nodes, unspecified: Secondary | ICD-10-CM | POA: Diagnosis not present

## 2021-07-05 DIAGNOSIS — N912 Amenorrhea, unspecified: Secondary | ICD-10-CM | POA: Diagnosis not present

## 2021-07-05 HISTORY — DX: Migraine, unspecified, not intractable, without status migrainosus: G43.909

## 2021-07-05 HISTORY — DX: Other muscle spasm: M62.838

## 2021-07-05 HISTORY — PX: LAPAROSCOPIC APPENDECTOMY: SHX408

## 2021-07-05 HISTORY — DX: Gastro-esophageal reflux disease without esophagitis: K21.9

## 2021-07-05 SURGERY — APPENDECTOMY, LAPAROSCOPIC
Anesthesia: General

## 2021-07-05 MED ORDER — DEXAMETHASONE SODIUM PHOSPHATE 10 MG/ML IJ SOLN
INTRAMUSCULAR | Status: DC | PRN
  Administered 2021-07-05: 10 mg via INTRAVENOUS

## 2021-07-05 MED ORDER — FENTANYL CITRATE (PF) 100 MCG/2ML IJ SOLN
INTRAMUSCULAR | Status: AC
  Filled 2021-07-05: qty 2

## 2021-07-05 MED ORDER — SUCCINYLCHOLINE CHLORIDE 200 MG/10ML IV SOSY
PREFILLED_SYRINGE | INTRAVENOUS | Status: DC | PRN
  Administered 2021-07-05: 200 mg via INTRAVENOUS

## 2021-07-05 MED ORDER — CEFAZOLIN IN SODIUM CHLORIDE 3-0.9 GM/100ML-% IV SOLN
3.0000 g | INTRAVENOUS | Status: DC
  Filled 2021-07-05: qty 100

## 2021-07-05 MED ORDER — CHLORHEXIDINE GLUCONATE 0.12 % MT SOLN
15.0000 mL | Freq: Once | OROMUCOSAL | Status: AC
  Administered 2021-07-05: 15 mL via OROMUCOSAL

## 2021-07-05 MED ORDER — ENOXAPARIN SODIUM 40 MG/0.4ML IJ SOSY
40.0000 mg | PREFILLED_SYRINGE | Freq: Once | INTRAMUSCULAR | Status: AC
  Administered 2021-07-05: 40 mg via SUBCUTANEOUS
  Filled 2021-07-05: qty 0.4

## 2021-07-05 MED ORDER — LACTATED RINGERS IV SOLN: INTRAVENOUS | Status: DC

## 2021-07-05 MED ORDER — PHENYLEPHRINE 40 MCG/ML (10ML) SYRINGE FOR IV PUSH (FOR BLOOD PRESSURE SUPPORT)
PREFILLED_SYRINGE | INTRAVENOUS | Status: AC
  Filled 2021-07-05: qty 30

## 2021-07-05 MED ORDER — PHENYLEPHRINE 40 MCG/ML (10ML) SYRINGE FOR IV PUSH (FOR BLOOD PRESSURE SUPPORT)
PREFILLED_SYRINGE | INTRAVENOUS | Status: AC
  Filled 2021-07-05: qty 10

## 2021-07-05 MED ORDER — CHLORHEXIDINE GLUCONATE CLOTH 2 % EX PADS: 6.0000 | MEDICATED_PAD | Freq: Once | CUTANEOUS | Status: DC

## 2021-07-05 MED ORDER — AMISULPRIDE (ANTIEMETIC) 5 MG/2ML IV SOLN
10.0000 mg | Freq: Once | INTRAVENOUS | Status: AC | PRN
  Administered 2021-07-05: 10 mg via INTRAVENOUS

## 2021-07-05 MED ORDER — OXYCODONE HCL 5 MG/5ML PO SOLN: 5.0000 mg | Freq: Once | ORAL | Status: AC | PRN

## 2021-07-05 MED ORDER — SCOPOLAMINE 1 MG/3DAYS TD PT72
MEDICATED_PATCH | TRANSDERMAL | Status: AC
  Filled 2021-07-05: qty 1

## 2021-07-05 MED ORDER — FENTANYL CITRATE PF 50 MCG/ML IJ SOSY
25.0000 ug | PREFILLED_SYRINGE | INTRAMUSCULAR | Status: DC | PRN
  Administered 2021-07-05: 50 ug via INTRAVENOUS
  Administered 2021-07-05 (×2): 25 ug via INTRAVENOUS

## 2021-07-05 MED ORDER — SODIUM CHLORIDE 0.9 % IR SOLN
Status: DC | PRN
  Administered 2021-07-05: 1000 mL

## 2021-07-05 MED ORDER — OXYCODONE HCL 5 MG PO TABS
5.0000 mg | ORAL_TABLET | Freq: Once | ORAL | Status: AC | PRN
  Administered 2021-07-05: 5 mg via ORAL

## 2021-07-05 MED ORDER — SCOPOLAMINE 1 MG/3DAYS TD PT72
1.0000 | MEDICATED_PATCH | TRANSDERMAL | Status: DC
  Administered 2021-07-05: 1.5 mg via TRANSDERMAL

## 2021-07-05 MED ORDER — PROMETHAZINE HCL 25 MG/ML IJ SOLN: 6.2500 mg | INTRAMUSCULAR | Status: DC | PRN

## 2021-07-05 MED ORDER — ROCURONIUM BROMIDE 10 MG/ML (PF) SYRINGE
PREFILLED_SYRINGE | INTRAVENOUS | Status: DC | PRN
  Administered 2021-07-05: 20 mg via INTRAVENOUS
  Administered 2021-07-05: 40 mg via INTRAVENOUS

## 2021-07-05 MED ORDER — SUCCINYLCHOLINE CHLORIDE 200 MG/10ML IV SOSY
PREFILLED_SYRINGE | INTRAVENOUS | Status: AC
  Filled 2021-07-05: qty 20

## 2021-07-05 MED ORDER — DEXMEDETOMIDINE (PRECEDEX) IN NS 20 MCG/5ML (4 MCG/ML) IV SYRINGE
PREFILLED_SYRINGE | INTRAVENOUS | Status: DC | PRN
  Administered 2021-07-05: 12 ug via INTRAVENOUS
  Administered 2021-07-05: 8 ug via INTRAVENOUS

## 2021-07-05 MED ORDER — FENTANYL CITRATE (PF) 100 MCG/2ML IJ SOLN
INTRAMUSCULAR | Status: DC | PRN
  Administered 2021-07-05 (×2): 50 ug via INTRAVENOUS

## 2021-07-05 MED ORDER — GABAPENTIN 300 MG PO CAPS
300.0000 mg | ORAL_CAPSULE | ORAL | Status: DC
  Filled 2021-07-05: qty 1

## 2021-07-05 MED ORDER — DEXAMETHASONE SODIUM PHOSPHATE 10 MG/ML IJ SOLN
INTRAMUSCULAR | Status: AC
  Filled 2021-07-05: qty 1

## 2021-07-05 MED ORDER — PROPOFOL 10 MG/ML IV BOLUS
INTRAVENOUS | Status: AC
  Filled 2021-07-05: qty 20

## 2021-07-05 MED ORDER — AMISULPRIDE (ANTIEMETIC) 5 MG/2ML IV SOLN
INTRAVENOUS | Status: AC
  Filled 2021-07-05: qty 4

## 2021-07-05 MED ORDER — BUPIVACAINE-EPINEPHRINE (PF) 0.25% -1:200000 IJ SOLN
INTRAMUSCULAR | Status: AC
  Filled 2021-07-05: qty 30

## 2021-07-05 MED ORDER — OXYCODONE-ACETAMINOPHEN 5-325 MG PO TABS: 1.0000 | ORAL_TABLET | ORAL | 0 refills | Status: DC | PRN

## 2021-07-05 MED ORDER — DEXTROSE 5 % IV SOLN
INTRAVENOUS | Status: DC | PRN
  Administered 2021-07-05: 3 g via INTRAVENOUS

## 2021-07-05 MED ORDER — ONDANSETRON HCL 4 MG/2ML IJ SOLN
INTRAMUSCULAR | Status: AC
  Filled 2021-07-05: qty 2

## 2021-07-05 MED ORDER — DEXMEDETOMIDINE (PRECEDEX) IN NS 20 MCG/5ML (4 MCG/ML) IV SYRINGE
PREFILLED_SYRINGE | INTRAVENOUS | Status: AC
  Filled 2021-07-05: qty 5

## 2021-07-05 MED ORDER — BUPIVACAINE LIPOSOME 1.3 % IJ SUSP: 20.0000 mL | Freq: Once | INTRAMUSCULAR | Status: DC

## 2021-07-05 MED ORDER — BUPIVACAINE-EPINEPHRINE (PF) 0.25% -1:200000 IJ SOLN
INTRAMUSCULAR | Status: DC | PRN
  Administered 2021-07-05: 30 mL via PERINEURAL

## 2021-07-05 MED ORDER — EPHEDRINE 5 MG/ML INJ
INTRAVENOUS | Status: AC
  Filled 2021-07-05: qty 5

## 2021-07-05 MED ORDER — ALBUMIN HUMAN 5 % IV SOLN
INTRAVENOUS | Status: AC
  Filled 2021-07-05: qty 250

## 2021-07-05 MED ORDER — PROPOFOL 500 MG/50ML IV EMUL
INTRAVENOUS | Status: DC | PRN
  Administered 2021-07-05: 20 ug/kg/min via INTRAVENOUS

## 2021-07-05 MED ORDER — FENTANYL CITRATE PF 50 MCG/ML IJ SOSY
PREFILLED_SYRINGE | INTRAMUSCULAR | Status: AC
  Filled 2021-07-05: qty 2

## 2021-07-05 MED ORDER — ORAL CARE MOUTH RINSE: 15.0000 mL | Freq: Once | OROMUCOSAL | Status: AC

## 2021-07-05 MED ORDER — CELECOXIB 200 MG PO CAPS
400.0000 mg | ORAL_CAPSULE | ORAL | Status: AC
  Administered 2021-07-05: 400 mg via ORAL
  Filled 2021-07-05: qty 2

## 2021-07-05 MED ORDER — ACETAMINOPHEN 500 MG PO TABS
1000.0000 mg | ORAL_TABLET | ORAL | Status: AC
  Administered 2021-07-05: 1000 mg via ORAL
  Filled 2021-07-05: qty 2

## 2021-07-05 MED ORDER — ONDANSETRON HCL 4 MG/2ML IJ SOLN
INTRAMUSCULAR | Status: DC | PRN
  Administered 2021-07-05: 4 mg via INTRAVENOUS

## 2021-07-05 MED ORDER — LIDOCAINE HCL (PF) 2 % IJ SOLN
INTRAMUSCULAR | Status: DC | PRN
  Administered 2021-07-05: 100 mg via INTRADERMAL

## 2021-07-05 MED ORDER — ROCURONIUM BROMIDE 10 MG/ML (PF) SYRINGE
PREFILLED_SYRINGE | INTRAVENOUS | Status: AC
  Filled 2021-07-05: qty 10

## 2021-07-05 MED ORDER — MIDAZOLAM HCL 5 MG/5ML IJ SOLN
INTRAMUSCULAR | Status: DC | PRN
  Administered 2021-07-05: 2 mg via INTRAVENOUS

## 2021-07-05 MED ORDER — LACTATED RINGERS IR SOLN
Status: DC | PRN
  Administered 2021-07-05: 1000 mL

## 2021-07-05 MED ORDER — MIDAZOLAM HCL 2 MG/2ML IJ SOLN
INTRAMUSCULAR | Status: AC
  Filled 2021-07-05: qty 2

## 2021-07-05 MED ORDER — SUGAMMADEX SODIUM 200 MG/2ML IV SOLN
INTRAVENOUS | Status: DC | PRN
  Administered 2021-07-05: 400 mg via INTRAVENOUS

## 2021-07-05 MED ORDER — PROPOFOL 10 MG/ML IV BOLUS
INTRAVENOUS | Status: DC | PRN
  Administered 2021-07-05: 200 mg via INTRAVENOUS
  Administered 2021-07-05: 50 mg via INTRAVENOUS
  Administered 2021-07-05: 20 mg via INTRAVENOUS

## 2021-07-05 MED ORDER — OXYCODONE HCL 5 MG PO TABS
ORAL_TABLET | ORAL | Status: AC
  Filled 2021-07-05: qty 1

## 2021-07-05 SURGICAL SUPPLY — 60 items
ADH SKN CLS APL DERMABOND .7 (GAUZE/BANDAGES/DRESSINGS) ×1
APL PRP STRL LF DISP 70% ISPRP (MISCELLANEOUS) ×1
APPLIER CLIP 5 13 M/L LIGAMAX5 (MISCELLANEOUS)
APPLIER CLIP ROT 10 11.4 M/L (STAPLE)
APR CLP MED LRG 11.4X10 (STAPLE)
APR CLP MED LRG 5 ANG JAW (MISCELLANEOUS)
BAG COUNTER SPONGE SURGICOUNT (BAG) ×1 IMPLANT
BAG SPEC RTRVL 10 TROC 200 (ENDOMECHANICALS) ×1
BAG SPNG CNTER NS LX DISP (BAG)
CABLE HIGH FREQUENCY MONO STRZ (ELECTRODE) ×2 IMPLANT
CHLORAPREP W/TINT 26 (MISCELLANEOUS) ×2 IMPLANT
CLIP APPLIE 5 13 M/L LIGAMAX5 (MISCELLANEOUS) IMPLANT
CLIP APPLIE ROT 10 11.4 M/L (STAPLE) IMPLANT
CUTTER FLEX LINEAR 45M (STAPLE) ×2 IMPLANT
DECANTER SPIKE VIAL GLASS SM (MISCELLANEOUS) ×1 IMPLANT
DERMABOND ADVANCED (GAUZE/BANDAGES/DRESSINGS) ×1
DERMABOND ADVANCED .7 DNX12 (GAUZE/BANDAGES/DRESSINGS) ×1 IMPLANT
DRAIN CHANNEL 19F RND (DRAIN) ×1 IMPLANT
ELECT REM PT RETURN 15FT ADLT (MISCELLANEOUS) ×2 IMPLANT
ENDOLOOP SUT PDS II  0 18 (SUTURE)
ENDOLOOP SUT PDS II 0 18 (SUTURE) IMPLANT
EVACUATOR SILICONE 100CC (DRAIN) ×1 IMPLANT
GAUZE 4X4 16PLY ~~LOC~~+RFID DBL (SPONGE) ×1 IMPLANT
GLOVE SURG ENC MOIS LTX SZ7.5 (GLOVE) ×2 IMPLANT
GLOVE SURG UNDER LTX SZ8 (GLOVE) ×2 IMPLANT
GOWN STRL REUS W/TWL XL LVL3 (GOWN DISPOSABLE) ×4 IMPLANT
GRASPER SUT TROCAR 14GX15 (MISCELLANEOUS) ×2 IMPLANT
HEMOSTAT SNOW SURGICEL 2X4 (HEMOSTASIS) ×1 IMPLANT
IRRIG SUCT STRYKERFLOW 2 WTIP (MISCELLANEOUS) ×2
IRRIGATION SUCT STRKRFLW 2 WTP (MISCELLANEOUS) ×2 IMPLANT
KIT BASIN OR (CUSTOM PROCEDURE TRAY) ×2 IMPLANT
KIT TURNOVER KIT A (KITS) IMPLANT
NDL INSUFFLATION 14GA 120MM (NEEDLE) ×1 IMPLANT
NDL INSUFFLATION 14GA 150MM (NEEDLE) IMPLANT
NEEDLE INSUFFLATION 14GA 120MM (NEEDLE) ×2 IMPLANT
NEEDLE INSUFFLATION 14GA 150MM (NEEDLE) ×2 IMPLANT
PENCIL SMOKE EVACUATOR (MISCELLANEOUS) ×1 IMPLANT
POUCH RETRIEVAL ECOSAC 10 (ENDOMECHANICALS) IMPLANT
POUCH RETRIEVAL ECOSAC 10MM (ENDOMECHANICALS) ×2
RELOAD 45 VASCULAR/THIN (ENDOMECHANICALS) ×2 IMPLANT
RELOAD STAPLE 45 2.5 WHT GRN (ENDOMECHANICALS) IMPLANT
RELOAD STAPLE 45 3.5 BLU ETS (ENDOMECHANICALS) IMPLANT
RELOAD STAPLE 60 3.6 BLU REG (STAPLE) IMPLANT
RELOAD STAPLE TA45 3.5 REG BLU (ENDOMECHANICALS) IMPLANT
RELOAD STAPLER BLUE 60MM (STAPLE) ×2 IMPLANT
SCISSORS LAP 5X35 DISP (ENDOMECHANICALS) IMPLANT
SET TUBE SMOKE EVAC HIGH FLOW (TUBING) ×2 IMPLANT
SHEARS HARMONIC ACE PLUS 36CM (ENDOMECHANICALS) ×1 IMPLANT
SLEEVE ADV FIXATION 5X100MM (TROCAR) ×2 IMPLANT
STAPLER ECHELON LONG 60 440 (INSTRUMENTS) ×1 IMPLANT
STAPLER RELOAD BLUE 60MM (STAPLE) ×4
SUT ETHILON 2 0 PS N (SUTURE) ×1 IMPLANT
SUT ETHILON 3 0 PS 1 (SUTURE) IMPLANT
SUT MNCRL AB 4-0 PS2 18 (SUTURE) ×2 IMPLANT
TOWEL OR 17X26 10 PK STRL BLUE (TOWEL DISPOSABLE) IMPLANT
TRAY FOLEY MTR SLVR 14FR STAT (SET/KITS/TRAYS/PACK) IMPLANT
TRAY FOLEY MTR SLVR 16FR STAT (SET/KITS/TRAYS/PACK) IMPLANT
TRAY LAPAROSCOPIC (CUSTOM PROCEDURE TRAY) ×2 IMPLANT
TROCAR ADV FIXATION 5X100MM (TROCAR) ×2 IMPLANT
TROCAR XCEL 12X100 BLDLESS (ENDOMECHANICALS) ×2 IMPLANT

## 2021-07-05 NOTE — Op Note (Signed)
Patient: Samantha Morse (06-15-2001, [id number])  Date of Surgery: 07/05/2021   Preoperative Diagnosis: APPENDICITIS   Postoperative Diagnosis: APPENDICITIS   Surgical Procedure: APPENDECTOMY LAPAROSCOPIC:    Operative Team Members:  Surgeon(s) and Role:    * Samantha Morse, Samantha Hopes, MD - Primary   Anesthesiologist: Samantha Dance, MD CRNA: Samantha Ina, CRNA   Anesthesia: General   Fluids:  No intake/output data recorded.  Complications: None  Drains:  (19 Fr) Jackson-Pratt drain(s) with closed bulb suction in the right pericolic gutter exiting the suprapubic port site    Specimen:  ID Type Source Tests Collected by Time Destination  1 :  Tissue PATH Appendix SURGICAL PATHOLOGY Samantha Morse, Samantha Hopes, MD 07/05/2021 1618      Disposition:  PACU - hemodynamically stable.  Plan of Care: Discharge to home after PACU    Indications for Procedure: Samantha Morse is an 20 y.o. female with acute appendicitis here for interval appendectomy after continued pain despite antibiotic treatment.  I recommended laparoscopic appendectomy.  The procedure itself as well as its risks, benefits and alternatives were discussed and the patient consented to proceed.  We will proceed as scheduled.  Findings: Severely inflamed appendix with thick inflammation at the base so staple line was placed onto the healthy cecum tissue being careful not to narrow the terminal ileum.   Description of Procedure:   On the date stated above, the patient was taken to the operating room suite and placed in supine positioning with the left arm tucked.  Sequential compression devices were placed on the lower extremities to prevent blood clots.  General endotracheal anesthesia was induced.  A foley catheter was placed to decompress the bladder, it was removed at the end of the case.   Preoperative antibiotics (cefazolin) were given within 30 minutes of incision.  The patient's abdomen was prepped and draped in the usual  sterile fashion.  A time-out was completed verifying the correct patient, procedure, positioning and equipment needed for the case.  We began by anesthetizing the skin with local anesthetic and then making a 5 mm incision just below the umbilicus.  We dissected through the subcutaneous tissues to the fascia.  The fascia was grasped and elevated using a Kocher clamp.  A Veress needle was inserted into the abdomen and the abdomen was insufflated to 15 mmHg.  A 5 mm trocar was inserted in this position under optical guidance and then the abdomen was inspected.  There was no trauma to the underlying viscera with initial trocar placement.  Any abnormal findings, other than inflammation in the right lower quadrant, are listed above in the findings section.  Two additional trocars were placed, one 5 mm trocar suprapubically and one 12 mm trocar in the left lower quadrant.  These were placed under direct vision without any trauma to the underlying viscera.    The patient was then placed in head down, left side down positioning.  The appendix was identified and dissected free from its attachments to the abdominal wall, small intestine and cecum.  As this inflammation has been going on for two weeks and not responding to antibiotics, there was consistent densely inflamed fibroses of the surrounding tissues making the dissection difficult.  The harmonic scalpel was used to free the appendix out of this fibrotic inflamed tissue and mobilize the cecum out of the retroperitoneum by dividing the white line of Toldt.  The base of the appendix was not soft enough to form a staple line so  two blue loads of the 60 mm Ethicon endoscopic linear stapler was used to divide healthy cecum near the base of the appendix being careful not to narrow the ileocecal valve or terminal ileum.    Due to the inflammation, a 19 Fr round JP drain was placed in the right pericolic gutter and brought out through the suprapubic port site.  Two clips  were used to control bleeding.  Hemostasis was good.  Snow topical hemostatic agent was placed over the divided mesoappendix.   At this point we directed our attention to closure.  The patient was moved back to a level position.  The 12 mm trocar site was closed at the fascial level using an 0-vicryl on a fascial suture passer.  The abdomen was desufflated.  The skin was closed using 4-0 Monocryl and dermabond.  All sponge and needle counts were correct at the end of the case.  At the end of the case we reviewed the infection status of the case. Patient: Private Patient Elective Case - Continued disease after recent admission to the emergency general surgery service at [facility] Case: Urgent Infection Present At Time Of Surgery (PATOS):  Infected and inflamed appendix  Ivar Drape, MD General, Bariatric, & Minimally Invasive Surgery [facility], [place]

## 2021-07-05 NOTE — Discharge Instructions (Signed)
 APPENDECTOMY POST OPERATIVE INSTRUCTIONS  Thinking Clearly  The anesthesia may cause you to feel different for 1 or 2 days. Do not drive, drink alcohol, or make any big decisions for at least 2 days.  Nutrition When you wake up, you will be able to drink small amounts of liquid. If you do not feel sick, you can slowly advance your diet to regular foods. Continue to drink lots of fluids, usually about 8 to 10 glasses per day. Eat a high-fiber diet so you don't strain during bowel movements. High-Fiber Foods Foods high in fiber include beans, bran cereals and whole-grain breads, peas, dried fruit (figs, apricots, and dates), raspberries, blackberries, strawberries, sweet corn, broccoli, baked potatoes with skin, plums, pears, apples, greens, and nuts. Activity Slowly increase your activity. Be sure to get up and walk every hour or so to prevent blood clots. No heavy lifting or strenuous activity for 4 weeks following surgery to prevent hernias at your incision sites It is normal to feel tired. You may need more sleep than usual.  Get your rest but make sure to get up and move around frequently to prevent blood clots and pneumonia.  Work and Return to School You can go back to work when you feel well enough. Discuss the timing with your surgeon. You can usually go back to school or work 1 week or less after an operation for an unruptured appendix and up to 2 weeks after a ruptured appendix. If your work requires heavy lifting or strenuous activity you need to be placed on light duty for 4 weeks following surgery. You can return to gym class, sports or other physical activities 4 weeks after surgery.  Wound Care Always wash your hands before and after touching near your incision site. Do not soak in a bathtub until cleared at your follow up appointment. You may take a shower 24 hours after surgery. A small amount of drainage from the incision is normal. If the drainage is thick and yellow or  the site is red, you may have an infection, so call your surgeon. If you have a drain in one of your incisions, it will be taken out in office when the drainage stops. Steri-Strips will fall off in 7 to 10 days or they will be removed during your first office visit. If you have dermabond glue covering over the incision, allow the glue to flake off on its own. Avoid wearing tight or rough clothing. It may rub your incisions and make it harder for them to heal. Protect the new skin, especially from the sun. The sun can burn and cause darker scarring. Your scar will heal in about 4 to 6 weeks and will become softer and continue to fade over the next year.  The cosmetic appearance of the incisions will improve over the course of the first year after surgery. Sensation around your incision will return in a few weeks or months.  Bowel Movements After intestinal surgery, you may have loose watery stools for several days. If watery diarrhea lasts longer than 3 days, contact your surgeon. Pain medication (narcotics) can cause constipation. Increase the fiber in your diet with high-fiber foods if you are constipated. You can take an over the counter stool softener like Colace to avoid constipation.  Additional over the counter medications can also be used if Colace isn't sufficient (for example, Milk of Magnesia or Miralax).  Pain The amount of pain is different for each person. Some people need only 1 to   3 doses of pain control medication, while others need more. Take alternating doses of tylenol and ibuprofen around the clock for the first five days following surgery.  This will provide a baseline of pain control and help with inflammation.  Take the narcotic pain medication in addition if needed for severe pain.  Contact Your Surgeon at [phone], if you have: Pain that will not go away Pain that gets worse A fever of more than 101F (38.3C) Repeated vomiting Swelling, redness, bleeding, or  bad-smelling drainage from your wound site Strong abdominal pain No bowel movement or unable to pass gas for 3 days Watery diarrhea lasting longer than 3 days  Pain Control The goal of pain control is to minimize pain, keep you moving and help you heal. Your surgical team will work with you on your pain plan. Most often a combination of therapies and medications are used to control your pain. You may also be given medication (local anesthetic) at the surgical site. This may help control your pain for several days. Extreme pain puts extra stress on your body at a time when your body needs to focus on healing. Do not wait until your pain has reached a level "10" or is unbearable before telling your doctor or nurse. It is much easier to control pain before it becomes severe. Following a laparoscopic procedure, pain is sometimes felt in the shoulder. This is due to the gas inserted into your abdomen during the procedure. Moving and walking helps to decrease the gas and the right shoulder pain.  Use the guide below for ways to manage your post-operative pain. Learn more by going to facs.org/safepaincontrol.  How Intense Is My Pain Common Therapies to Feel Better       I hardly notice my pain, and it does not interfere with my activities.  I notice my pain and it distracts me, but I can still do activities (sitting up, walking, standing).  Non-Medication Therapies  Ice (in a bag, applied over clothing at the surgical site), elevation, rest, meditation, massage, distraction (music, TV, play) walking and mild exercise Splinting the abdomen with pillows +  Non-Opioid Medications Acetaminophen (Tylenol) Non-steroidal anti-inflammatory drugs (NSAIDS) Aspirin, Ibuprofen (Motrin, Advil) Naproxen (Aleve) Take these as needed, when you feel pain. Both acetaminophen and NSAIDs help to decrease pain and swelling (inflammation).      My pain is hard to ignore and is more noticeable even when I  rest.  My pain interferes with my usual activities.  Non-Medication Therapies  +  Non-Opioid medications  Take on a regular schedule (around-the-clock) instead of as needed. (For example, Tylenol every 6 hours at 9:00 am, 3:00 pm, 9:00 pm, 3:00 am and Motrin every 6 hours at 12:00 am, 6:00 am, 12:00 pm, 6:00 pm)         I am focused on my pain, and I am not doing my daily activities.  I am groaning in pain, and I cannot sleep. I am unable to do anything.  My pain is as bad as it could be, and nothing else matters.  Non-Medication Therapies  +  Around-the-Clock Non-Opioid Medications  +  Short-acting opioids  Opioids should be used with other medications to manage severe pain. Opioids block pain and give a feeling of euphoria (feel high). Addiction, a serious side effect of opioids, is rare with short-term (a few days) use.  Examples of short-acting opioids include: Tramadol (Ultram), Hydrocodone (Norco, Vicodin), Hydromorphone (Dilaudid), Oxycodone (Oxycontin)     The above   directions have been adapted from the American College of Surgeons Surgical Patient Education Program.  Please refer to the ACS website if needed: https://www.facs.org/education/patient-education/patient-resources/operations/appendectomy.   Alfonso Shackett, MD [facility] [address], [address], [place], [place]  [address] ?  [address] [address], [place], [place]   [address] [phone] ? [phone] ? FAX [phone] Web site: [email or web address]  

## 2021-07-05 NOTE — Anesthesia Procedure Notes (Signed)
Procedure Name: Intubation Date/Time: 07/05/2021 3:50 PM Performed by: Ezekiel Ina, CRNA Pre-anesthesia Checklist: Patient identified, Emergency Drugs available, Suction available and Patient being monitored Patient Re-evaluated:Patient Re-evaluated prior to induction Oxygen Delivery Method: Circle system utilized Preoxygenation: Pre-oxygenation with 100% oxygen Induction Type: IV induction Ventilation: Mask ventilation without difficulty Laryngoscope Size: Glidescope and 4 Grade View: Grade I Tube type: Oral Tube size: 7.0 mm Number of attempts: 1 Airway Equipment and Method: Video-laryngoscopy Placement Confirmation: ETT inserted through vocal cords under direct vision, positive ETCO2 and breath sounds checked- equal and bilateral Secured at: 22 cm Tube secured with: Tape Dental Injury: Teeth and Oropharynx as per pre-operative assessment  Difficulty Due To: Difficulty was anticipated, Difficult Airway- due to large tongue, Difficult Airway-  due to edematous airway and Difficult Airway- due to limited oral opening Future Recommendations: Recommend- induction with short-acting agent, and alternative techniques readily available

## 2021-07-05 NOTE — Transfer of Care (Signed)
Immediate Anesthesia Transfer of Care Note  Patient: Samantha Morse  Procedure(s) Performed: APPENDECTOMY LAPAROSCOPIC  Patient Location: PACU  Anesthesia Type:General  Level of Consciousness: awake  Airway & Oxygen Therapy: Patient Spontanous Breathing and Patient connected to face mask oxygen  Post-op Assessment: Report given to RN and Post -op Vital signs reviewed and stable  Post vital signs: Reviewed and stable  Last Vitals:  Vitals Value Taken Time  BP 133/78 07/05/21 1730  Temp    Pulse 96 07/05/21 1732  Resp 18 07/05/21 1732  SpO2 99 % 07/05/21 1732  Vitals shown include unvalidated device data.  Last Pain:  Vitals:   07/05/21 1411  TempSrc: Oral  PainSc:       Patients Stated Pain Goal: 3 (07/01/21 1034)  Complications: No notable events documented.

## 2021-07-05 NOTE — H&P (Signed)
Admitting Physician: Nickola Major Marshall Roehrich  Service: General surgery  CC: abdominal pain - appendicitis  Subjective   HPI: Samantha Morse is an 20 y.o. female who is here for interval appendectomy after antibiotic treatment of appendicitis.  Past Medical History:  Diagnosis Date   Asthma    prn inhaler   Complication of anesthesia 04/27/2015   vomiting and laryngospasm on extubation, requiring CPAP, jaw thrust and succinylcholine   GERD (gastroesophageal reflux disease)    Hidradenitis axillaris 07/2015   right   Irregular periods    Hx - now on OCP daily   Migraines    Muscle spasm    Obesity    Pollen allergy    PONV (postoperative nausea and vomiting)     Past Surgical History:  Procedure Laterality Date   HYDRADENITIS EXCISION Left 04/27/2015   Procedure: EXCISION HIDRADENITIS LEFT AXILLA RYAN [place] ;  Surgeon: Cristine Polio, MD;  Location: [facility];  Service: Plastics;  Laterality: Left;   HYDRADENITIS EXCISION Right 08/10/2015   Procedure: EXCISION HIDRADENITIS RIGHT AXILLA, RYAN POLLOCK CLOSURE;  Surgeon: Cristine Polio, MD;  Location: [place];  Service: Plastics;  Laterality: Right;   TONSILLECTOMY Bilateral 03/15/2019   Procedure: TONSILLECTOMY;  Surgeon: Helayne Seminole, MD;  Location: [facility] OR;  Service: ENT;  Laterality: Bilateral;    Family History  Problem Relation Age of Onset   Hypertension Maternal Grandmother    Kidney cancer Maternal Grandmother    Hypertension Mother    Heart disease Mother        MI   Pulmonary embolism Mother    Thyroid disease Mother    Hernia Mother    Diabetes Maternal Uncle    Diabetes Maternal Grandfather    Hypertension Maternal Grandfather    Cirrhosis Maternal Aunt     Social:  reports that she has quit smoking. Her smoking use included cigars. She has never used smokeless tobacco. She reports current alcohol use. She reports current drug use. Drug:  Marijuana.  Allergies:  Allergies  Allergen Reactions   Bee Venom Other (See Comments)    Red eyes and a stuffy nose (only, per the patient)   Bee Pollen Other (See Comments)    REDNESS OF EYES, STUFFY NOSE   Pollen Extract Other (See Comments)    REDNESS OF EYES, STUFFY NOSE    Medications: Current Outpatient Medications  Medication Instructions   acetaminophen (TYLENOL) 650 mg, Oral, Every 6 hours   albuterol (PROVENTIL HFA;VENTOLIN HFA) 108 (90 Base) MCG/ACT inhaler 1-2 puffs, Inhalation, Every 6 hours PRN   cyclobenzaprine (FLEXERIL) 10 mg, Oral, 2 times daily PRN   dicyclomine (BENTYL) 20 mg, Oral, 2 times daily   famotidine (PEPCID) 20 mg, Oral, 2 times daily   gabapentin (NEURONTIN) 300 mg, Oral, 3 times daily   ibuprofen (ADVIL) 800 mg, Oral, Every 8 hours PRN   methocarbamol (ROBAXIN) 500 mg, Oral, 3 times daily   naproxen (NAPROSYN) 500 mg, Oral, 2 times daily   ondansetron (ZOFRAN ODT) 8 mg, Oral, Every 8 hours PRN   rizatriptan (MAXALT-MLT) 10 mg, Oral, As needed, May repeat in 2 hours if needed    ROS - all of the below systems have been reviewed with the patient and positives are indicated with bold text General: chills, fever or night sweats Eyes: blurry vision or double vision ENT: epistaxis or sore throat Allergy/Immunology: itchy/watery eyes or nasal congestion Hematologic/Lymphatic: bleeding problems, blood clots or swollen lymph nodes Endocrine: temperature  intolerance or unexpected weight changes Breast: new or changing breast lumps or nipple discharge Resp: cough, shortness of breath, or wheezing CV: chest pain or dyspnea on exertion GI: as per HPI GU: dysuria, trouble voiding, or hematuria MSK: joint pain or joint stiffness Neuro: TIA or stroke symptoms Derm: pruritus and skin lesion changes Psych: anxiety and depression  Objective   PE Blood pressure (!) 149/99, pulse 70, temperature 98.3 F (36.8 C), temperature source Oral, resp. rate 18,  height 5\' 5"  (1.651 m), weight (!) 148.8 kg, SpO2 99 %. Constitutional: NAD; conversant; no deformities Eyes: Moist conjunctiva; no lid lag; anicteric; PERRL Neck: Trachea midline; no thyromegaly Lungs: Normal respiratory effort; no tactile fremitus CV: RRR; no palpable thrills; no pitting edema GI: Abd Soft, RLQ tenderness; no palpable hepatosplenomegaly MSK: Normal range of motion of extremities; no clubbing/cyanosis Psychiatric: Appropriate affect; alert and oriented x3 Lymphatic: No palpable cervical or axillary lymphadenopathy  No results found for this or any previous visit (from the past 24 hour(s)).  Imaging Orders  No imaging studies ordered today     Assessment and Plan   Samantha Morse is an 20 y.o. female with acute appendicitis here for interval appendectomy after continued pain despite antibiotic treatment.  I recommended laparoscopic appendectomy.  The procedure itself as well as its risks, benefits and alternatives were discussed and the patient consented to proceed.  We will proceed as scheduled.  36, MD  [facility] Use [email or web address] to contact on call provider

## 2021-07-05 NOTE — Anesthesia Preprocedure Evaluation (Addendum)
Anesthesia Evaluation  Patient identified by MRN, date of birth, ID band Patient awake    Reviewed: Allergy & Precautions, NPO status , Patient's Chart, lab work & pertinent test results  History of Anesthesia Complications (+) PONV and history of anesthetic complications  Airway Mallampati: III  TM Distance: >3 FB Neck ROM: Full   Comment: Large neck circumference Dental no notable dental hx. (+) Dental Advisory Given   Pulmonary asthma , Current Smoker and Patient abstained from smoking., former smoker,    Pulmonary exam normal        Cardiovascular Normal cardiovascular exam     Neuro/Psych  Headaches,  Neuromuscular disease    GI/Hepatic GERD  ,(+)     substance abuse  marijuana use,   Endo/Other  Morbid obesity  Renal/GU negative Renal ROS     Musculoskeletal negative musculoskeletal ROS (+)   Abdominal   Peds negative pediatric ROS (+)  Hematology negative hematology ROS (+)   Anesthesia Other Findings Day of surgery medications reviewed with the patient.  Reproductive/Obstetrics                           Anesthesia Physical  Anesthesia Plan  ASA: 3  Anesthesia Plan: General   Post-op Pain Management:    Induction: Intravenous and Rapid sequence  PONV Risk Score and Plan: 4 or greater and Ondansetron, Dexamethasone and Scopolamine patch - Pre-op  Airway Management Planned: Oral ETT and Video Laryngoscope Planned  Additional Equipment: None  Intra-op Plan:   Post-operative Plan: Extubation in OR  Informed Consent: I have reviewed the patients History and Physical, chart, labs and discussed the procedure including the risks, benefits and alternatives for the proposed anesthesia with the patient or authorized representative who has indicated his/her understanding and acceptance.     Dental advisory given  Plan Discussed with: CRNA and Anesthesiologist  Anesthesia  Plan Comments: (GETA. Glidescope for intubation. RSI. Tanna Furry, MD  )      Anesthesia Quick Evaluation

## 2021-07-05 NOTE — Anesthesia Postprocedure Evaluation (Signed)
Anesthesia Post Note  Patient: Samantha Morse  Procedure(s) Performed: APPENDECTOMY LAPAROSCOPIC     Patient location during evaluation: PACU Anesthesia Type: General Level of consciousness: awake and alert Pain management: pain level controlled Vital Signs Assessment: post-procedure vital signs reviewed and stable Respiratory status: spontaneous breathing, nonlabored ventilation and respiratory function stable Cardiovascular status: blood pressure returned to baseline and stable Postop Assessment: no apparent nausea or vomiting Anesthetic complications: no   No notable events documented.  Last Vitals:  Vitals:   07/05/21 1800 07/05/21 1815  BP: (!) 147/84 (!) 156/97  Pulse: 84 86  Resp: 15 (!) 22  Temp:  36.4 C  SpO2: 96% 92%    Last Pain:  Vitals:   07/05/21 1815  TempSrc:   PainSc: Asleep                 Mellody Dance

## 2021-07-06 ENCOUNTER — Emergency Department (HOSPITAL_COMMUNITY)
Admission: EM | Admit: 2021-07-06 | Discharge: 2021-07-06 | Disposition: A | Discharge status: Home / Self Care | Payer: Medicaid Other | Attending: Emergency Medicine | Admitting: Emergency Medicine

## 2021-07-06 ENCOUNTER — Other Ambulatory Visit: Payer: Self-pay

## 2021-07-06 ENCOUNTER — Encounter (HOSPITAL_COMMUNITY): Payer: Self-pay

## 2021-07-06 DIAGNOSIS — Z09 Encounter for follow-up examination after completed treatment for conditions other than malignant neoplasm: Secondary | ICD-10-CM

## 2021-07-06 NOTE — ED Triage Notes (Signed)
Pt reports with surgical pain from an appendectomy yesterday evening.

## 2021-07-06 NOTE — Discharge Instructions (Signed)
Exam was reassuring.  Please continue with home medication as prescribed, please follow postop instructions per your surgeon.  Given the contacted information for [facility] as well as the surgeon who performed your appendicectomy today please call for further questioning.  I want you to come back to the emergency department if you develop fevers, chills, have uncontrolled nausea, vomiting, unable to have a bowel movement or pass gas, has severe worsening pain,   Drainage coming from the surgical site or in the surgical drain.

## 2021-07-06 NOTE — ED Provider Notes (Signed)
[facility]-EMERGENCY DEPT Provider Note   CSN: [id number] Arrival date & time: 07/05/21  2357     History Chief Complaint  Patient presents with   Surgical Pain    Samantha Morse is a 20 y.o. female.  HPI  Patient with no significant medical history presents with chief complaint of stomach pain.  Patient states pain is all over her abdomen, started after she had her appendix removed today, she rates it as a 10/10 feels the same after her surgery earlier in the day.  She has no associated nausea, vomiting, denies  urinary symptoms, she denies passing gas or having  bowel movements.  She states she is tolerating p.o. at this time, she has had no associate fevers or chills.  She states she been taking her pain medications as prescribed without much relief.  She has no other complaints.  She denies alleviating or aggravating factors.  She does not endorse chest pain, shortness of breath, worsening peripheral edema.  After reviewing patient's chart patient seen by dr stechschulte of [facility] surgery where she had a appendectomy performed, with drain in place and was later discharged that day.  Past Medical History:  Diagnosis Date   Asthma    prn inhaler   Complication of anesthesia 04/27/2015   vomiting and laryngospasm on extubation, requiring CPAP, jaw thrust and succinylcholine   GERD (gastroesophageal reflux disease)    Hidradenitis axillaris 07/2015   right   Irregular periods    Hx - now on OCP daily   Migraines    Muscle spasm    Obesity    Pollen allergy    PONV (postoperative nausea and vomiting)     Patient Active Problem List   Diagnosis Date Noted   Acute appendicitis 06/15/2021   Migraine without aura and without status migrainosus, not intractable 01/07/2021   Cervical myofascial pain syndrome 01/07/2021   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   Peritonsillar abscess 02/18/2019   Obesity 06/28/2016   Hidradenitis suppurativa  06/28/2016   Amenorrhea 06/28/2016    Past Surgical History:  Procedure Laterality Date   HYDRADENITIS EXCISION Left 04/27/2015   Procedure: EXCISION HIDRADENITIS LEFT AXILLA [name] CLOSURE ;  Surgeon: Louisa Second, MD;  Location: [facility];  Service: Plastics;  Laterality: Left;   HYDRADENITIS EXCISION Right 08/10/2015   Procedure: EXCISION HIDRADENITIS RIGHT AXILLA, RYAN POLLOCK CLOSURE;  Surgeon: Louisa Second, MD;  Location: [facility];  Service: Plastics;  Laterality: Right;   TONSILLECTOMY Bilateral 03/15/2019   Procedure: TONSILLECTOMY;  Surgeon: Graylin Shiver, MD;  Location: [facility] OR;  Service: ENT;  Laterality: Bilateral;     OB History     Gravida  0   Para  0   Term  0   Preterm  0   AB  0   Living  0      SAB  0   IAB  0   Ectopic  0   Multiple  0   Live Births  0           Family History  Problem Relation Age of Onset   Hypertension Maternal Grandmother    Kidney cancer Maternal Grandmother    Hypertension Mother    Heart disease Mother        MI   Pulmonary embolism Mother    Thyroid disease Mother    Hernia Mother    Diabetes Maternal Uncle    Diabetes Maternal Grandfather    Hypertension  Maternal Grandfather    Cirrhosis Maternal Aunt     Social History   Tobacco Use   Smoking status: Former    Types: Cigars   Smokeless tobacco: Never   Tobacco comments:    Black and Mild Cigars, 2 cigars/week  Vaping Use   Vaping Use: Never used  Substance Use Topics   Alcohol use: Yes    Comment: socially   Drug use: Yes    Types: Marijuana    Comment: daily    Home Medications Prior to Admission medications   Medication Sig Start Date End Date Taking? Authorizing Provider  acetaminophen (TYLENOL) 325 MG tablet Take 2 tablets (650 mg total) by mouth every 6 (six) hours. Patient taking differently: Take 325-650 mg by mouth See admin instructions. Take 325-650 mg by mouth every 4-6 hours as  needed for pain or headaches 03/15/19   Graylin Shiver, MD  albuterol (PROVENTIL HFA;VENTOLIN HFA) 108 (90 Base) MCG/ACT inhaler Inhale 1-2 puffs into the lungs every 6 (six) hours as needed for wheezing or shortness of breath. 12/03/18   Rancour, Jeannett Senior, MD  cyclobenzaprine (FLEXERIL) 10 MG tablet Take 1 tablet (10 mg total) by mouth 2 (two) times daily as needed for muscle spasms. Patient not taking: Reported on 06/15/2021 04/24/20   Moshe Cipro, NP  dicyclomine (BENTYL) 20 MG tablet Take 1 tablet (20 mg total) by mouth 2 (two) times daily. Patient not taking: Reported on 07/01/2021 06/11/21   Linwood Dibbles, MD  famotidine (PEPCID) 20 MG tablet Take 1 tablet (20 mg total) by mouth 2 (two) times daily. 06/11/21   Linwood Dibbles, MD  gabapentin (NEURONTIN) 300 MG capsule Take 1 capsule (300 mg total) by mouth 3 (three) times daily. Patient taking differently: Take 300 mg by mouth 3 (three) times daily as needed (as directed). 01/07/21   Anson Fret, MD  ibuprofen (ADVIL) 800 MG tablet Take 1 tablet (800 mg total) by mouth every 8 (eight) hours as needed for moderate pain. Patient not taking: Reported on 06/15/2021 04/24/20   Moshe Cipro, NP  methocarbamol (ROBAXIN) 500 MG tablet Take 1 tablet (500 mg total) by mouth 3 (three) times daily. Patient taking differently: Take 500 mg by mouth 3 (three) times daily as needed for muscle spasms. 01/07/21   Anson Fret, MD  naproxen (NAPROSYN) 500 MG tablet Take 1 tablet (500 mg total) by mouth 2 (two) times daily. Patient not taking: Reported on 06/15/2021 11/02/20   Kugler, [place], MD  ondansetron (ZOFRAN ODT) 8 MG disintegrating tablet Take 1 tablet (8 mg total) by mouth every 8 (eight) hours as needed for nausea or vomiting. Patient not taking: Reported on 06/15/2021 06/11/21   Linwood Dibbles, MD  oxyCODONE-acetaminophen (PERCOCET) 5-325 MG tablet Take 1 tablet by mouth every 4 (four) hours as needed for severe pain. 07/05/21 07/05/22   Stechschulte, Hyman Hopes, MD  rizatriptan (MAXALT-MLT) 10 MG disintegrating tablet Take 1 tablet (10 mg total) by mouth as needed for migraine. May repeat in 2 hours if needed Patient taking differently: Take 10 mg by mouth as needed for migraine (as directed and may repeat once in 2 hours, if no relief (dissolve orally)). 01/07/21   Anson Fret, MD    Allergies    Bee venom, Bee pollen, and Pollen extract  Review of Systems   Review of Systems  Constitutional:  Negative for chills and fever.  HENT:  Negative for congestion.   Respiratory:  Negative for shortness of breath.   Cardiovascular:  Negative for chest pain.  Gastrointestinal:  Positive for abdominal pain. Negative for diarrhea, nausea and vomiting.  Genitourinary:  Negative for enuresis.  Musculoskeletal:  Negative for back pain.  Skin:  Negative for rash.  Neurological:  Negative for dizziness.  Hematological:  Does not bruise/bleed easily.   Physical Exam Updated Vital Signs BP (!) 157/105 (BP Location: Left Arm)   Pulse 86   Temp 97.9 F (36.6 C) (Oral)   Resp 18   Ht 5\' 5"  (1.651 m)   Wt (!) 145.2 kg   LMP  (LMP Unknown)   SpO2 98%   BMI 53.25 kg/m   Physical Exam Vitals and nursing note reviewed.  Constitutional:      General: She is not in acute distress.    Appearance: She is not ill-appearing.     Comments: Slightly somnolent on my exam but easily arousable.   HENT:     Head: Normocephalic and atraumatic.     Nose: No congestion.  Eyes:     Conjunctiva/sclera: Conjunctivae normal.  Cardiovascular:     Rate and Rhythm: Normal rate and regular rhythm.     Pulses: Normal pulses.     Heart sounds: No murmur heard.   No friction rub. No gallop.  Pulmonary:     Effort: No respiratory distress.     Breath sounds: No wheezing, rhonchi or rales.  Abdominal:     Palpations: Abdomen is soft.     Tenderness: There is abdominal tenderness. There is no right CVA tenderness or left CVA tenderness.      Comments: Abdomen nondistended, dull to percussion, normal bowel sounds, she has a drain noted in the left lower quadrant with serosanguineous fluid mixed with blood, there is no noted drainage or discharge coming from the tube placement, second surgical wound on the left lower flank has surgical glue without signs of infection present.  Abdomen was slightly tender to palpation in the left lower quadrant, there is no guarding, rebound tenderness, peritoneal sign.  Skin:    General: Skin is warm and dry.  Neurological:     Mental Status: She is alert.  Psychiatric:        Mood and Affect: Mood normal.    ED Results / Procedures / Treatments   Labs (all labs ordered are listed, but only abnormal results are displayed) Labs Reviewed - No data to display  EKG None  Radiology No results found.  Procedures Procedures   Medications Ordered in ED Medications - No data to display  ED Course  I have reviewed the triage vital signs and the nursing notes.  Pertinent labs & imaging results that were available during my care of the patient were reviewed by me and considered in my medical decision making (see chart for details).    MDM Rules/Calculators/A&P                          Initial impression-presents with stomach pain.  She is alert, does not appear in acute stress, vital signs reassuring.  Will p.o. challenge, withhold  further pain management as patient was slightly somnolent on my exam, showed no signs acute distress, vital signs reassuring.  Consult with general surgery for further recommendations.  Work-up-due to well-appearing patient, benign physical exam, further lab or imaging are not warranted at this time.  Reassessment-patient was reassessed, she is tolerating p.o., has no complaints, vital signs remained stable, patient agreed for discharge at this time.  Consult-spoke  with Dr. Carolynne Edouard of general surgery, he had no recommendations.  Rule out-low suspicion for systemic  infection as patient is nontoxic-appearing, vital signs are reassuring.  I have low suspicion for bowel obstruction as abdomen is nondistended, normal bowel sounds, dull to percussion, she is tolerating p.o., does not endorse nausea or vomiting.  I have low suspicion for intra-abdominal infection this patient under gone surgery less than 10 hours ago unlikely that she is ever developed an infection, she is also nontoxic-appearing, vital signs are reassuring, surgical wounds demonstrating signs infections, drain does not show signs of infection.  Plan-  Postop pain-we will have her continue with her home medications, have her follow-up with general surgery for further evaluation, gave her strict return precautions.  Vital signs have remained stable, no indication for hospital admission.  Patient discussed with attending and they agreed with assessment and plan.  Patient given at home care as well strict return precautions.  Patient verbalized that they understood agreed to said plan.  Final Clinical Impression(s) / ED Diagnoses Final diagnoses:  Postop check    Rx / DC Orders ED Discharge Orders     None        Carroll Sage, PA-C 07/06/21 1093    Glynn Octave, MD 07/06/21 [phone]

## 2021-07-07 LAB — SURGICAL PATHOLOGY

## 2021-07-21 DIAGNOSIS — Z20822 Contact with and (suspected) exposure to covid-19: Secondary | ICD-10-CM | POA: Diagnosis not present

## 2021-07-21 DIAGNOSIS — Z87891 Personal history of nicotine dependence: Secondary | ICD-10-CM | POA: Diagnosis not present

## 2021-07-21 DIAGNOSIS — R051 Acute cough: Secondary | ICD-10-CM | POA: Diagnosis not present

## 2021-07-21 DIAGNOSIS — R7309 Other abnormal glucose: Secondary | ICD-10-CM | POA: Diagnosis not present

## 2021-07-21 DIAGNOSIS — J029 Acute pharyngitis, unspecified: Secondary | ICD-10-CM | POA: Diagnosis not present

## 2021-07-21 DIAGNOSIS — J309 Allergic rhinitis, unspecified: Secondary | ICD-10-CM | POA: Diagnosis not present

## 2021-07-21 DIAGNOSIS — R0989 Other specified symptoms and signs involving the circulatory and respiratory systems: Secondary | ICD-10-CM | POA: Diagnosis not present

## 2021-08-16 ENCOUNTER — Ambulatory Visit: Payer: Medicaid Other | Admitting: Adult Health

## 2021-08-16 ENCOUNTER — Encounter: Payer: Self-pay | Admitting: Adult Health

## 2021-08-16 VITALS — BP 132/88 | HR 78 | Ht 64.0 in | Wt 330.8 lb

## 2021-08-16 DIAGNOSIS — G43009 Migraine without aura, not intractable, without status migrainosus: Secondary | ICD-10-CM | POA: Diagnosis not present

## 2021-08-16 DIAGNOSIS — M7918 Myalgia, other site: Secondary | ICD-10-CM | POA: Diagnosis not present

## 2021-08-16 NOTE — Progress Notes (Signed)
PATIENT: Samantha Morse DOB: 07/28/2001  REASON FOR VISIT: follow up HISTORY FROM: patient PRIMARY NEUROLOGIST: Samantha Morse  HISTORY OF PRESENT ILLNESS: Today 08/16/21:  Samantha Morse is a 20 year old female with a history of migraines and cervical myofascial muscle pain.  She returns today for follow-up.  She continues on gabapentin 300 mg 3 times a day.  She continues to take Robaxin as needed.  Reports that she probably takes it 3-4 times a week.  She feels that her migraines are under relatively good control.  She may have 1-2 headaches a week.  States that Maxalt works fairly well for her.  She still has dizziness.  States that she may go several weeks with no episodes and then may have a day where she feels dizzy most of the day.  She returns today for an evaluation.  HISTORY (copied from Dr. Trevor Morse Morse) /18/2022: Migraines and cervical myofascial muscle pain, has had MRI brain and c-spine imaging without any concerning results. She has been going to PT for dry needling and taking methocarbamol,gabapentin for the myofascial pain and maxalt prn for migraines. On review of her med list, there is also baclofen, flexeril and tramadol on there now (discussed sedation and resp suppression if combining muscle relaxants and other sedating medications). she has not had that many migraines. She has some stiffness and cramping. Her legs will cramp up. She can get some stiffness in her back. It helps to lay flat. She has something to take when she gets migraines so happy with that. They graduated her from PT. Continue methocarbamol and maxalt as needed, gabapentin as needed.   REVIEW OF SYSTEMS: Out of a complete 14 system review of symptoms, the patient complains only of the following symptoms, and all other reviewed systems are negative.  ALLERGIES: Allergies  Allergen Reactions   Bee Venom Other (See Comments)    Red eyes and a stuffy nose (only, per the patient)   Bee Pollen Other (See Comments)     REDNESS OF EYES, STUFFY NOSE   Pollen Extract Other (See Comments)    REDNESS OF EYES, STUFFY NOSE    HOME MEDICATIONS: Outpatient Medications Prior to Visit  Medication Sig Dispense Refill   acetaminophen (TYLENOL) 325 MG tablet Take 2 tablets (650 mg total) by mouth every 6 (six) hours. (Patient taking differently: Take 325-650 mg by mouth See admin instructions. Take 325-650 mg by mouth every 4-6 hours as needed for pain or headaches)     albuterol (PROVENTIL HFA;VENTOLIN HFA) 108 (90 Base) MCG/ACT inhaler Inhale 1-2 puffs into the lungs every 6 (six) hours as needed for wheezing or shortness of breath. 1 Inhaler 0   cyclobenzaprine (FLEXERIL) 10 MG tablet Take 1 tablet (10 mg total) by mouth 2 (two) times daily as needed for muscle spasms. 20 tablet 0   dicyclomine (BENTYL) 20 MG tablet Take 1 tablet (20 mg total) by mouth 2 (two) times daily. 20 tablet 0   famotidine (PEPCID) 20 MG tablet Take 1 tablet (20 mg total) by mouth 2 (two) times daily. 14 tablet 0   gabapentin (NEURONTIN) 300 MG capsule Take 1 capsule (300 mg total) by mouth 3 (three) times daily. (Patient taking differently: Take 300 mg by mouth 3 (three) times daily as needed (as directed).) 90 capsule 11   ibuprofen (ADVIL) 800 MG tablet Take 1 tablet (800 mg total) by mouth every 8 (eight) hours as needed for moderate pain. 21 tablet 0   methocarbamol (ROBAXIN) 500 MG  tablet Take 1 tablet (500 mg total) by mouth 3 (three) times daily. (Patient taking differently: Take 500 mg by mouth 3 (three) times daily as needed for muscle spasms.) 90 tablet 3   naproxen (NAPROSYN) 500 MG tablet Take 1 tablet (500 mg total) by mouth 2 (two) times daily. 30 tablet 0   ondansetron (ZOFRAN ODT) 8 MG disintegrating tablet Take 1 tablet (8 mg total) by mouth every 8 (eight) hours as needed for nausea or vomiting. 12 tablet 0   oxyCODONE-acetaminophen (PERCOCET) 5-325 MG tablet Take 1 tablet by mouth every 4 (four) hours as needed for severe  pain. 20 tablet 0   rizatriptan (MAXALT-MLT) 10 MG disintegrating tablet Take 1 tablet (10 mg total) by mouth as needed for migraine. May repeat in 2 hours if needed (Patient taking differently: Take 10 mg by mouth as needed for migraine (as directed and may repeat once in 2 hours, if no relief (dissolve orally)).) 9 tablet 11   No facility-administered medications prior to visit.    PAST MEDICAL HISTORY: Past Medical History:  Diagnosis Date   Asthma    prn inhaler   Complication of anesthesia 04/27/2015   vomiting and laryngospasm on extubation, requiring CPAP, jaw thrust and succinylcholine   GERD (gastroesophageal reflux disease)    Hidradenitis axillaris 07/2015   right   Irregular periods    Hx - now on OCP daily   Migraines    Muscle spasm    Obesity    Pollen allergy    PONV (postoperative nausea and vomiting)     PAST SURGICAL HISTORY: Past Surgical History:  Procedure Laterality Date   HYDRADENITIS EXCISION Left 04/27/2015   Procedure: EXCISION HIDRADENITIS LEFT AXILLA Samantha Morse CLOSURE ;  Surgeon: Samantha Second, MD;  Location: [facility];  Service: Plastics;  Laterality: Left;   HYDRADENITIS EXCISION Right 08/10/2015   Procedure: EXCISION HIDRADENITIS RIGHT AXILLA, Samantha POLLOCK CLOSURE;  Surgeon: Samantha Second, MD;  Location: [facility];  Service: Plastics;  Laterality: Right;   LAPAROSCOPIC APPENDECTOMY N/A 07/05/2021   Procedure: APPENDECTOMY LAPAROSCOPIC;  Surgeon: Morse, Samantha Hopes, MD;  Location: [facility];  Service: General;  Laterality: N/A;   TONSILLECTOMY Bilateral 03/15/2019   Procedure: TONSILLECTOMY;  Surgeon: Samantha Shiver, MD;  Location: [facility];  Service: ENT;  Laterality: Bilateral;    FAMILY HISTORY: Family History  Problem Relation Age of Onset   Hypertension Mother    Heart disease Mother        MI   Pulmonary embolism Mother    Thyroid disease Mother    Hernia Mother    Migraines Mother     Cirrhosis Maternal Aunt    Diabetes Maternal Uncle    Hypertension Maternal Grandmother    Kidney cancer Maternal Grandmother    Diabetes Maternal Grandfather    Hypertension Maternal Grandfather     SOCIAL HISTORY: Social History   Socioeconomic History   Marital status: Significant Other    Spouse name: Not on file   Number of children: 0   Years of education: Not on file   Highest education level: Some college, no degree  Occupational History   Not on file  Tobacco Use   Smoking status: Former    Types: Cigars   Smokeless tobacco: Never   Tobacco comments:    Black and Mild Cigars, 2 cigars/week  Vaping Use   Vaping Use: Never used  Substance and Sexual Activity   Alcohol use: Yes    Comment:  socially   Drug use: Yes    Types: Marijuana    Comment: daily   Sexual activity: Yes    Birth control/protection: Pill  Other Topics Concern   Not on file  Social History Narrative   Lives with sig other   Social Determinants of Health   Financial Resource Strain: Not on file  Food Insecurity: Not on file  Transportation Needs: Not on file  Physical Activity: Not on file  Stress: Not on file  Social Connections: Not on file  Intimate Partner Violence: Not on file      PHYSICAL EXAM  Vitals:   08/16/21 1020  BP: 132/88  Pulse: 78  Weight: (!) 330 lb 12.8 oz (150 kg)  Height: 5\' 4"  (1.626 m)   Body mass index is 56.78 kg/m.  Generalized: Well developed, in no acute distress   Neurological examination  Mentation: Alert oriented to time, place, history taking. Follows all commands speech and language fluent Cranial nerve II-XII: Pupils were equal round reactive to light. Extraocular movements were full, visual field were full on confrontational test. Facial sensation and strength were normal. Uvula tongue midline. Head turning and shoulder shrug  were normal and symmetric. Motor: The motor testing reveals 5 over 5 strength of all 4 extremities. Good symmetric  motor tone is noted throughout.  Sensory: Sensory testing is intact to soft touch on all 4 extremities. No evidence of extinction is noted.  Coordination: Cerebellar testing reveals good finger-nose-finger and heel-to-shin bilaterally.  Gait and station: Gait is normal.  Reflexes: Deep tendon reflexes are symmetric and normal bilaterally.   DIAGNOSTIC DATA (LABS, IMAGING, TESTING) - I reviewed patient records, labs, notes, testing and imaging myself where available.  Lab Results  Component Value Date   WBC 7.6 06/16/2021   HGB 12.2 06/16/2021   HCT 35.9 (L) 06/16/2021   MCV 85.5 06/16/2021   PLT 270 06/16/2021      Component Value Date/Time   NA 139 06/16/2021 0237   NA 142 03/19/2021 1148   K 4.6 06/16/2021 0237   CL 107 06/16/2021 0237   CO2 20 (L) 06/16/2021 0237   GLUCOSE 98 06/16/2021 0237   BUN 6 06/16/2021 0237   BUN 7 03/19/2021 1148   CREATININE 0.62 06/16/2021 0237   CALCIUM 8.8 (L) 06/16/2021 0237   PROT 6.2 (L) 06/16/2021 0237   PROT 6.6 03/24/2021 1138   ALBUMIN 3.1 (L) 06/16/2021 0237   ALBUMIN 4.0 03/19/2021 1148   AST 27 06/16/2021 0237   ALT 28 06/16/2021 0237   ALKPHOS 62 06/16/2021 0237   BILITOT 0.7 06/16/2021 0237   BILITOT 0.6 03/19/2021 1148   GFRNONAA >60 06/16/2021 0237   GFRAA >60 03/22/2019 0046   No results found for: CHOL, HDL, LDLCALC, LDLDIRECT, TRIG, CHOLHDL Lab Results  Component Value Date   HGBA1C 5.0 03/24/2021   Lab Results  Component Value Date   VITAMINB12 423 03/24/2021   Lab Results  Component Value Date   TSH 1.800 03/19/2021      ASSESSMENT AND PLAN 20 y.o. year old female  has a past medical history of Asthma, Complication of anesthesia (04/27/2015), GERD (gastroesophageal reflux disease), Hidradenitis axillaris (07/2015), Irregular periods, Migraines, Muscle spasm, Obesity, Pollen allergy, and PONV (postoperative nausea and vomiting). here with :  Migraine headaches  Continue Maxalt for abortive therapy  2.   Myofascial pain  Continue gabapentin 300 mg 3 times a day Continue Robaxin 500 mg 3 times a day as needed.  Follow-up in 1  year or sooner if needed   Butch Penny, MSN, NP-C 08/16/2021, 10:37 AM [facility] [address] [place], [place] [address] [phone]

## 2021-09-01 ENCOUNTER — Encounter: Payer: Self-pay | Admitting: Internal Medicine

## 2021-09-20 ENCOUNTER — Ambulatory Visit: Payer: Medicaid Other | Admitting: Internal Medicine

## 2021-12-06 ENCOUNTER — Encounter: Payer: Self-pay | Admitting: Neurology

## 2021-12-13 ENCOUNTER — Ambulatory Visit: Payer: Medicaid Other | Admitting: Adult Health

## 2021-12-13 ENCOUNTER — Encounter: Payer: Self-pay | Admitting: Adult Health

## 2021-12-13 VITALS — BP 150/95 | HR 82 | Ht 65.0 in | Wt 330.0 lb

## 2021-12-13 DIAGNOSIS — G43009 Migraine without aura, not intractable, without status migrainosus: Secondary | ICD-10-CM

## 2021-12-13 DIAGNOSIS — G5711 Meralgia paresthetica, right lower limb: Secondary | ICD-10-CM

## 2021-12-13 DIAGNOSIS — M7918 Myalgia, other site: Secondary | ICD-10-CM | POA: Diagnosis not present

## 2021-12-13 MED ORDER — RIZATRIPTAN BENZOATE 10 MG PO TBDP: 10.0000 mg | ORAL_TABLET | ORAL | 11 refills | Status: DC | PRN

## 2021-12-13 NOTE — Patient Instructions (Signed)
Your Plan: ? ?Try taking gabapentin three times day ?When possible take Robaxin three times day ?Continue stretching and exercise ? ? ?Thank you for coming to see [place] at [facility]. I hope we have been able to provide you high quality care today. ? ?You may receive a patient satisfaction survey over the next few weeks. We would appreciate your feedback and comments so that we may continue to improve ourselves and the health of our patients. ? ?

## 2021-12-13 NOTE — Progress Notes (Signed)
? ? ?PATIENT: Samantha Morse ?DOB: 19-Dec-2000 ? ?REASON FOR VISIT: follow up ?HISTORY FROM: patient ?PRIMARY NEUROLOGIST: Dr. Daisy Blossom ? ?Chief Complaint  ?Patient presents with  ? Rm 19  ?  Here alone. Migraines have been worse. She states lately she has had a bad headache, numbness in different places like her arm, her thigh/leg. Her right thigh has been numb for a week. Her neck tightens up. Does massage/exercises learned from therapy. Still tight in neck.   ? ? ?HISTORY OF PRESENT ILLNESS: ?Today 12/13/21: ? ?Samantha Morse is a 21 year old female with a history of migraine headaches.  ?Migrianes are worse. Reports that she has 3-4 headaches a week. Most of these headaches are severe. Frontal region and around eyes. Denies photophobia and phonophobia. Uses Tylenol for mild headaches. Taking gabapentin as needed. Usually before and after work. Not taking it three times a day. States that she does not take it at work. Takes Robaxin BID usually with gabapentin. Works at [employer] ? ?Has numbness in the right thigh for the last week. Reports that she can hold something in the right or left had for a long period of time because her hands would go numb. At least once a day she will have numbness in the upper extremities.  Patient had nerve conduction studies in July 2022.  She reports that she was having numbness in the hands then.  This study was relatively unremarkable. ? ?08/16/21: Samantha Morse is a 21 year old female with a history of migraines and cervical myofascial muscle pain.  She returns today for follow-up.  She continues on gabapentin 300 mg 3 times a day.  She continues to take Robaxin as needed.  Reports that she probably takes it 3-4 times a week.  She feels that her migraines are under relatively good control.  She may have 1-2 headaches a week.  States that Maxalt works fairly well for her.  She still has dizziness.  States that she may go several weeks with no episodes and then may have a day where she feels  dizzy most of the day.  She returns today for an evaluation. ? ?HISTORY (copied from Dr. Trevor Mace note) ?/18/2022: Migraines and cervical myofascial muscle pain, has had MRI brain and c-spine imaging without any concerning results. She has been going to PT for dry needling and taking methocarbamol,gabapentin for the myofascial pain and maxalt prn for migraines. On review of her med list, there is also baclofen, flexeril and tramadol on there now (discussed sedation and resp suppression if combining muscle relaxants and other sedating medications). she has not had that many migraines. She has some stiffness and cramping. Her legs will cramp up. She can get some stiffness in her back. It helps to lay flat. She has something to take when she gets migraines so happy with that. They graduated her from PT. Continue methocarbamol and maxalt as needed, gabapentin as needed.  ? ?REVIEW OF SYSTEMS: Out of a complete 14 system review of symptoms, the patient complains only of the following symptoms, and all other reviewed systems are negative. ? ?ALLERGIES: ?Allergies  ?Allergen Reactions  ? Bee Venom Other (See Comments)  ?  Red eyes and a stuffy nose (only, per the patient)  ? Bee Pollen Other (See Comments)  ?  REDNESS OF EYES, STUFFY NOSE  ? Pollen Extract Other (See Comments)  ?  REDNESS OF EYES, STUFFY NOSE  ? ? ?HOME MEDICATIONS: ?Outpatient Medications Prior to Visit  ?Medication Sig Dispense Refill  ?  acetaminophen (TYLENOL) 325 MG tablet Take 2 tablets (650 mg total) by mouth every 6 (six) hours. (Patient taking differently: Take 325-650 mg by mouth See admin instructions. Take 325-650 mg by mouth every 4-6 hours as needed for pain or headaches)    ? albuterol (PROVENTIL HFA;VENTOLIN HFA) 108 (90 Base) MCG/ACT inhaler Inhale 1-2 puffs into the lungs every 6 (six) hours as needed for wheezing or shortness of breath. 1 Inhaler 0  ? cyclobenzaprine (FLEXERIL) 10 MG tablet Take 1 tablet (10 mg total) by mouth 2 (two)  times daily as needed for muscle spasms. 20 tablet 0  ? dicyclomine (BENTYL) 20 MG tablet Take 1 tablet (20 mg total) by mouth 2 (two) times daily. 20 tablet 0  ? famotidine (PEPCID) 20 MG tablet Take 1 tablet (20 mg total) by mouth 2 (two) times daily. 14 tablet 0  ? gabapentin (NEURONTIN) 300 MG capsule Take 1 capsule (300 mg total) by mouth 3 (three) times daily. (Patient taking differently: Take 300 mg by mouth 3 (three) times daily as needed (as directed).) 90 capsule 11  ? ibuprofen (ADVIL) 800 MG tablet Take 1 tablet (800 mg total) by mouth every 8 (eight) hours as needed for moderate pain. 21 tablet 0  ? methocarbamol (ROBAXIN) 500 MG tablet Take 1 tablet (500 mg total) by mouth 3 (three) times daily. (Patient taking differently: Take 500 mg by mouth 3 (three) times daily as needed for muscle spasms.) 90 tablet 3  ? naproxen (NAPROSYN) 500 MG tablet Take 1 tablet (500 mg total) by mouth 2 (two) times daily. 30 tablet 0  ? ondansetron (ZOFRAN ODT) 8 MG disintegrating tablet Take 1 tablet (8 mg total) by mouth every 8 (eight) hours as needed for nausea or vomiting. 12 tablet 0  ? oxyCODONE-acetaminophen (PERCOCET) 5-325 MG tablet Take 1 tablet by mouth every 4 (four) hours as needed for severe pain. 20 tablet 0  ? rizatriptan (MAXALT-MLT) 10 MG disintegrating tablet Take 1 tablet (10 mg total) by mouth as needed for migraine. May repeat in 2 hours if needed (Patient taking differently: Take 10 mg by mouth as needed for migraine (as directed and may repeat once in 2 hours, if no relief (dissolve orally)).) 9 tablet 11  ? ?No facility-administered medications prior to visit.  ? ? ?PAST MEDICAL HISTORY: ?Past Medical History:  ?Diagnosis Date  ? Asthma   ? prn inhaler  ? Complication of anesthesia 04/27/2015  ? vomiting and laryngospasm on extubation, requiring CPAP, jaw thrust and succinylcholine  ? GERD (gastroesophageal reflux disease)   ? Hidradenitis axillaris 07/2015  ? right  ? Irregular periods   ? Hx -  now on OCP daily  ? Migraines   ? Muscle spasm   ? Obesity   ? Pollen allergy   ? PONV (postoperative nausea and vomiting)   ? ? ?PAST SURGICAL HISTORY: ?Past Surgical History:  ?Procedure Laterality Date  ? HYDRADENITIS EXCISION Left 04/27/2015  ? Procedure: EXCISION HIDRADENITIS LEFT AXILLA [name] CLOSURE ;  Surgeon: Louisa SecondGerald Truesdale, MD;  Location: [facility];  Service: Plastics;  Laterality: Left;  ? HYDRADENITIS EXCISION Right 08/10/2015  ? Procedure: EXCISION HIDRADENITIS RIGHT AXILLA, RYAN POLLOCK CLOSURE;  Surgeon: Louisa SecondGerald Truesdale, MD;  Location: [facility];  Service: Plastics;  Laterality: Right;  ? LAPAROSCOPIC APPENDECTOMY N/A 07/05/2021  ? Procedure: APPENDECTOMY LAPAROSCOPIC;  Surgeon: Quentin OreStechschulte, Paul J, MD;  Location: [facility];  Service: General;  Laterality: N/A;  ? TONSILLECTOMY Bilateral 03/15/2019  ?  Procedure: TONSILLECTOMY;  Surgeon: Graylin Shiver, MD;  Location: [facility];  Service: ENT;  Laterality: Bilateral;  ? ? ?FAMILY HISTORY: ?Family History  ?Problem Relation Age of Onset  ? Hypertension Mother   ? Heart disease Mother   ?     MI  ? Pulmonary embolism Mother   ? Thyroid disease Mother   ? Hernia Mother   ? Migraines Mother   ? Cirrhosis Maternal Aunt   ? Diabetes Maternal Uncle   ? Hypertension Maternal Grandmother   ? Kidney cancer Maternal Grandmother   ? Diabetes Maternal Grandfather   ? Hypertension Maternal Grandfather   ? ? ?SOCIAL HISTORY: ?Social History  ? ?Socioeconomic History  ? Marital status: Significant Other  ?  Spouse name: Not on file  ? Number of children: 0  ? Years of education: Not on file  ? Highest education level: Some college, no degree  ?Occupational History  ? Not on file  ?Tobacco Use  ? Smoking status: Former  ?  Types: Cigars  ? Smokeless tobacco: Never  ? Tobacco comments:  ?  Black and Mild Cigars, 2 cigars/week  ?Vaping Use  ? Vaping Use: Never used  ?Substance and Sexual Activity  ? Alcohol use: Yes  ?  Comment:  socially  ? Drug use: Yes  ?  Types: Marijuana  ?  Comment: daily  ? Sexual activity: Yes  ?  Birth control/protection: Pill  ?Other Topics Concern  ? Not on file  ?Social History Narrative  ? Lives with sig othe

## 2022-01-04 ENCOUNTER — Ambulatory Visit: Payer: Medicaid Other | Admitting: Adult Health

## 2022-03-03 DIAGNOSIS — N912 Amenorrhea, unspecified: Secondary | ICD-10-CM | POA: Diagnosis not present

## 2022-03-03 DIAGNOSIS — Z0000 Encounter for general adult medical examination without abnormal findings: Secondary | ICD-10-CM | POA: Diagnosis not present

## 2022-03-03 DIAGNOSIS — Z23 Encounter for immunization: Secondary | ICD-10-CM | POA: Diagnosis not present

## 2022-03-03 DIAGNOSIS — Z6841 Body Mass Index (BMI) 40.0 and over, adult: Secondary | ICD-10-CM | POA: Diagnosis not present

## 2022-03-03 DIAGNOSIS — R202 Paresthesia of skin: Secondary | ICD-10-CM | POA: Diagnosis not present

## 2022-03-27 ENCOUNTER — Other Ambulatory Visit: Payer: Self-pay | Admitting: Neurology

## 2022-04-29 ENCOUNTER — Ambulatory Visit: Payer: Medicaid Other | Admitting: Student

## 2022-05-24 ENCOUNTER — Ambulatory Visit (INDEPENDENT_AMBULATORY_CARE_PROVIDER_SITE_OTHER): Payer: Medicaid Other

## 2022-05-24 ENCOUNTER — Ambulatory Visit (HOSPITAL_COMMUNITY)
Admission: RE | Admit: 2022-05-24 | Discharge: 2022-05-24 | Disposition: A | Discharge status: Home / Self Care | Payer: Medicaid Other | Source: Ambulatory Visit | Attending: Internal Medicine | Admitting: Internal Medicine

## 2022-05-24 ENCOUNTER — Encounter (HOSPITAL_COMMUNITY): Payer: Self-pay

## 2022-05-24 ENCOUNTER — Encounter: Payer: Self-pay | Admitting: Adult Health

## 2022-05-24 VITALS — BP 156/79 | HR 94 | Temp 99.4°F | Resp 22

## 2022-05-24 DIAGNOSIS — J45909 Unspecified asthma, uncomplicated: Secondary | ICD-10-CM | POA: Diagnosis not present

## 2022-05-24 DIAGNOSIS — R0602 Shortness of breath: Secondary | ICD-10-CM | POA: Diagnosis not present

## 2022-05-24 DIAGNOSIS — R059 Cough, unspecified: Secondary | ICD-10-CM | POA: Insufficient documentation

## 2022-05-24 DIAGNOSIS — Z20822 Contact with and (suspected) exposure to covid-19: Secondary | ICD-10-CM | POA: Insufficient documentation

## 2022-05-24 DIAGNOSIS — J069 Acute upper respiratory infection, unspecified: Secondary | ICD-10-CM | POA: Insufficient documentation

## 2022-05-24 DIAGNOSIS — R062 Wheezing: Secondary | ICD-10-CM

## 2022-05-24 DIAGNOSIS — J209 Acute bronchitis, unspecified: Secondary | ICD-10-CM | POA: Insufficient documentation

## 2022-05-24 LAB — RESP PANEL BY RT-PCR (FLU A&B, COVID) ARPGX2
Influenza A by PCR: NEGATIVE
Influenza B by PCR: NEGATIVE
SARS Coronavirus 2 by RT PCR: NEGATIVE

## 2022-05-24 MED ORDER — BENZONATATE 100 MG PO CAPS: 100.0000 mg | ORAL_CAPSULE | Freq: Three times a day (TID) | ORAL | 0 refills | Status: DC

## 2022-05-24 MED ORDER — IPRATROPIUM-ALBUTEROL 0.5-2.5 (3) MG/3ML IN SOLN
3.0000 mL | Freq: Once | RESPIRATORY_TRACT | Status: AC
  Administered 2022-05-24: 3 mL via RESPIRATORY_TRACT

## 2022-05-24 MED ORDER — PREDNISONE 20 MG PO TABS: 40.0000 mg | ORAL_TABLET | Freq: Every day | ORAL | 0 refills | Status: AC

## 2022-05-24 MED ORDER — IPRATROPIUM-ALBUTEROL 0.5-2.5 (3) MG/3ML IN SOLN
RESPIRATORY_TRACT | Status: AC
  Filled 2022-05-24: qty 3

## 2022-05-24 MED ORDER — METHYLPREDNISOLONE SODIUM SUCC 125 MG IJ SOLR
125.0000 mg | Freq: Once | INTRAMUSCULAR | Status: AC
  Administered 2022-05-24: 125 mg via INTRAMUSCULAR

## 2022-05-24 MED ORDER — VENTOLIN HFA 108 (90 BASE) MCG/ACT IN AERS: 1.0000 | INHALATION_SPRAY | RESPIRATORY_TRACT | 0 refills | Status: AC | PRN

## 2022-05-24 MED ORDER — METHYLPREDNISOLONE SODIUM SUCC 125 MG IJ SOLR
INTRAMUSCULAR | Status: AC
  Filled 2022-05-24: qty 2

## 2022-05-24 NOTE — ED Provider Notes (Signed)
[facility]    CSN: [id number] Arrival date & time: 05/24/22  8032      History   Chief Complaint Chief Complaint  Patient presents with   Cough    Congestion; need breathing treatment - Entered by patient   Nasal Congestion    HPI Samantha Morse is a 21 y.o. female.   Patient presents urgent care for evaluation of cough, chest congestion, rib cage pain on both sides, and shortness of breath that started 2 days ago.  She has a history of asthma and allergic rhinitis and has been using her albuterol inhaler "too many times to count" in the last 24 hours to improve her breathing without relief.  Cough is sometimes productive but mostly dry.  Symptoms are improved with standing in the warm shower and breathing in the humidified air.  Rib cage pain is worsened by coughing.  She has been using Robitussin with elderberry syrup over-the-counter prior to arrival urgent care without much relief of symptoms.  She has had some fever/chills and has been taking Tylenol for this over-the-counter.  Denies known sick contacts, nausea, dizziness, chest pain, abdominal pain, back pain, neck discomfort, ear pain, sore throat, and headache.  She has been hospitalized in the past related to acute bronchitis but this has been many years ago.  States that symptoms feel similar to the last time she had bronchitis.  She is not diabetic and states that she smokes marijuana intermittently but denies cigarette use/other drug use.  She does not vape.  No other aggravating or relieving factors identified at this time per patient symptoms.   Cough ds  Past Medical History:  Diagnosis Date   Asthma    Migraines    Obesity     There are no problems to display for this patient.   Past Surgical History:  Procedure Laterality Date   APPENDECTOMY     TONSILLECTOMY      OB History   No obstetric history on file.      Home Medications    Prior to Admission medications   Medication Sig Start Date  End Date Taking? Authorizing Provider  albuterol (VENTOLIN HFA) 108 (90 Base) MCG/ACT inhaler Inhale 1-2 puffs into the lungs every 4 (four) hours as needed for wheezing or shortness of breath. 05/24/22  Yes Carlisle Beers, FNP  benzonatate (TESSALON) 100 MG capsule Take 1 capsule (100 mg total) by mouth every 8 (eight) hours. 05/24/22  Yes Carlisle Beers, FNP  gabapentin (NEURONTIN) 300 MG capsule Take 300 mg by mouth 3 (three) times daily. 03/27/22  Yes [provider]  methocarbamol (ROBAXIN) 500 MG tablet Take 500 mg by mouth 3 (three) times daily. 03/28/22  Yes [provider]  predniSONE (DELTASONE) 20 MG tablet Take 2 tablets (40 mg total) by mouth daily for 5 days. 05/24/22 05/29/22 Yes Carlisle Beers, FNP  rizatriptan (MAXALT-MLT) 10 MG disintegrating tablet Take by mouth. 11/29/21  Yes [provider]  Vitamin D, Ergocalciferol, (DRISDOL) 1.25 MG (50000 UNIT) CAPS capsule Take 50,000 Units by mouth once a week. 03/04/22  Yes [provider]    Family History Family History  Problem Relation Age of Onset   Thyroid disease Mother    Diabetes Mother    Hypertension Mother    Heart disease Mother     Social History Social History   Tobacco Use   Smoking status: Never   Smokeless tobacco: Never  Vaping Use   Vaping Use: Never used  Substance Use Topics   Alcohol use: Yes    Comment: socially   Drug use: Yes    Types: Marijuana     Allergies   Patient has no known allergies.   Review of Systems Review of Systems  Respiratory:  Positive for cough.   Per HPI   Physical Exam Triage Vital Signs ED Triage Vitals  Enc Vitals Group     BP 05/24/22 1051 (!) 173/97     Pulse Rate 05/24/22 1051 96     Resp 05/24/22 1051 (!) 22     Temp 05/24/22 1051 99.4 F (37.4 C)     Temp Source 05/24/22 1051 Oral     SpO2 05/24/22 1051 91 %     Weight --      Height --      Head Circumference --      Peak Flow --      Pain Score  05/24/22 1046 10     Pain Loc --      Pain Edu? --      Excl. in [place]? --    No data found.  Updated Vital Signs BP (!) 156/79 (BP Location: Left Arm)   Pulse 94   Temp 99.4 F (37.4 C) (Oral)   Resp (!) 22   LMP  (LMP Unknown)   SpO2 95%   Visual Acuity Right Eye Distance:   Left Eye Distance:   Bilateral Distance:    Right Eye Near:   Left Eye Near:    Bilateral Near:     Physical Exam Vitals and nursing note reviewed.  Constitutional:      Appearance: She is obese. She is ill-appearing. She is not toxic-appearing.  HENT:     Head: Normocephalic and atraumatic.     Right Ear: Hearing, tympanic membrane, ear canal and external ear normal.     Left Ear: Hearing, tympanic membrane, ear canal and external ear normal.     Nose: Rhinorrhea present.     Mouth/Throat:     Lips: Pink.     Mouth: Mucous membranes are moist.     Pharynx: No posterior oropharyngeal erythema.  Eyes:     General: Lids are normal. Vision grossly intact. Gaze aligned appropriately.     Extraocular Movements: Extraocular movements intact.     Conjunctiva/sclera: Conjunctivae normal.     Pupils: Pupils are equal, round, and reactive to light.  Cardiovascular:     Rate and Rhythm: Normal rate and regular rhythm.     Heart sounds: Normal heart sounds, S1 normal and S2 normal.  Pulmonary:     Breath sounds: Normal air entry. Wheezing present.     Comments: Increased work of breathing and diffuse inspiratory/expiratory wheezes heard to all lung fields particularly of the lower lung fields upon initial assessment.  Breath sounds significantly improved after DuoNeb breathing treatment with faint wheezing and normal work of breathing. Musculoskeletal:     Cervical back: Normal range of motion and neck supple.  Lymphadenopathy:     Cervical: Cervical adenopathy present.  Skin:    General: Skin is warm and dry.     Capillary Refill: Capillary refill takes less than 2 seconds.     Findings: No rash.   Neurological:     General: No focal deficit present.     Mental Status: She is alert and oriented to person, place, and time. Mental status is at baseline.     Cranial Nerves: No dysarthria or facial asymmetry.  Psychiatric:  Mood and Affect: Mood normal.        Speech: Speech normal.        Behavior: Behavior normal.        Thought Content: Thought content normal.        Judgment: Judgment normal.      UC Treatments / Results  Labs (all labs ordered are listed, but only abnormal results are displayed) Labs Reviewed  RESP PANEL BY RT-PCR (FLU A&B, COVID) [id number]    EKG   Radiology DG Chest 2 View  Result Date: 05/24/2022 CLINICAL DATA:  Shortness of breath, wheezing EXAM: CHEST - 2 VIEW COMPARISON:  12/03/2018 FINDINGS: Transverse diameter of heart is slightly increased. There are no signs of pulmonary edema or focal pulmonary consolidation. There is no pleural effusion or pneumothorax. There is mild peribronchial thickening. IMPRESSION: Mild peribronchial thickening suggests bronchitis. There are no signs of pulmonary edema or focal pulmonary consolidation. Electronically Signed   By: Ernie Avena M.D.   On: 05/24/2022 11:52    Procedures Procedures (including critical care time)  Medications Ordered in UC Medications  ipratropium-albuterol (DUONEB) 0.5-2.5 (3) MG/3ML nebulizer solution 3 mL (3 mLs Nebulization Given 05/24/22 1113)  methylPREDNISolone sodium succinate (SOLU-MEDROL) 125 mg/2 mL injection 125 mg (125 mg Intramuscular Given 05/24/22 1113)    Initial Impression / Assessment and Plan / UC Course  I have reviewed the triage vital signs and the nursing notes.  Pertinent labs & imaging results that were available during my care of the patient were reviewed by me and considered in my medical decision making (see chart for details).   1.  Acute bronchitis Symptoms and physical exam consistent with a viral upper respiratory tract infection that will  likely resolve with rest, fluids, and prescriptions for symptomatic relief.   Chest x-ray performed today shows findings that are consistent with acute bronchitis etiology.   COVID-19 and flu testing is pending.  We will call patient if this is positive.  Quarantine guidelines discussed. Currently on day 3 of symptoms and does qualify for antiviral therapy should COVID-19 testing come back positive.  Patient given DuoNeb and 125 Solu-Medrol IM injection in clinic today for increased work of breathing, wheezing, and inflammation to the lungs related to acute bronchitis and asthma. Tessalon Perles sent to the pharmacy to be used for  cough.  Prednisone 40 mg once daily for the next 5 days to be started tomorrow with breakfast.  Advised patient to avoid ibuprofen while taking prednisone due to increased risk of GI bleeding.  She may use Tylenol every 6 hours as needed for fever, chills, and headache while taking prednisone.    Cardiopulmonary exam significantly improved after one DuoNeb treatment in clinic.  Patient may use albuterol inhaler at home every 4-6 hours as needed for cough, wheezing, and shortness of breath.  Oxygen saturation 95% at discharge with improvement in blood pressure at 156/79.  Strict ED/urgent care return precautions given.  Patient verbalizes understanding and agreement with plan.  Counseled patient regarding possible side effects and uses of all medications prescribed at today's visit.  Patient verbalizes understanding and agreement with plan.  All questions answered.  Patient discharged from urgent care in stable condition.         Final Clinical Impressions(s) / UC Diagnoses   Final diagnoses:  Acute bronchitis, unspecified organism  Viral URI with cough     Discharge Instructions      You were seen in urgent care today for viral upper respiratory tract  infection and acute bronchitis.   We gave you a steroid injection in the clinic today to help with  inflammation in your lungs and a breathing treatment which improved your breathing and lung exam significantly.  Your chest x-ray shows findings that are consistent with bronchitis.  Your rib cage pain is likely related to coughing and increased work of breathing due to wheezing.  I would like for you to start taking prednisone 40 mg tomorrow for the next 5 days with breakfast every day.  Do not take any ibuprofen while you are taking this medicine as it can cause increased risk of GI bleeding.  Take this medicine with food to avoid stomach upset.  You may use Tessalon Perles every 8 hours as needed for cough.  Use albuterol inhaler 1 to 2 puffs every 6 hours as needed for wheezing and shortness of breath.  Schedule a follow-up appointment with your primary care provider for reevaluation in approximately 1 to 2 weeks.   We will call you if your COVID-19 test is positive as you are a candidate for Paxlovid prescription.  Stay at home until your COVID-19 testing comes back.  Your work note was at the end your packet.   If you develop any new or worsening symptoms or do not improve in the next 2 to 3 days, please return.  If your symptoms are severe, please go to the emergency room.  Follow-up with your primary care provider for further evaluation and management of your symptoms as well as ongoing wellness visits.  I hope you feel better!      ED Prescriptions     Medication Sig Dispense Auth. Provider   albuterol (VENTOLIN HFA) 108 (90 Base) MCG/ACT inhaler Inhale 1-2 puffs into the lungs every 4 (four) hours as needed for wheezing or shortness of breath. 54 g Reita May M, FNP   predniSONE (DELTASONE) 20 MG tablet Take 2 tablets (40 mg total) by mouth daily for 5 days. 10 tablet Reita May M, FNP   benzonatate (TESSALON) 100 MG capsule Take 1 capsule (100 mg total) by mouth every 8 (eight) hours. 21 capsule Carlisle Beers, FNP      PDMP not reviewed this  encounter.   Carlisle Beers, [place] 05/24/22 1253

## 2022-05-24 NOTE — Discharge Instructions (Addendum)
You were seen in urgent care today for viral upper respiratory tract infection and acute bronchitis.   We gave you a steroid injection in the clinic today to help with inflammation in your lungs and a breathing treatment which improved your breathing and lung exam significantly.  Your chest x-ray shows findings that are consistent with bronchitis.  Your rib cage pain is likely related to coughing and increased work of breathing due to wheezing.  I would like for you to start taking prednisone 40 mg tomorrow for the next 5 days with breakfast every day.  Do not take any ibuprofen while you are taking this medicine as it can cause increased risk of GI bleeding.  Take this medicine with food to avoid stomach upset.  You may use Tessalon Perles every 8 hours as needed for cough.  Use albuterol inhaler 1 to 2 puffs every 6 hours as needed for wheezing and shortness of breath.  Schedule a follow-up appointment with your primary care provider for reevaluation in approximately 1 to 2 weeks.   We will call you if your COVID-19 test is positive as you are a candidate for Paxlovid prescription.  Stay at home until your COVID-19 testing comes back.  Your work note was at the end your packet.   If you develop any new or worsening symptoms or do not improve in the next 2 to 3 days, please return.  If your symptoms are severe, please go to the emergency room.  Follow-up with your primary care provider for further evaluation and management of your symptoms as well as ongoing wellness visits.  I hope you feel better!

## 2022-05-24 NOTE — ED Triage Notes (Signed)
Pt states that she has cough and congestion x 2 days. Coughing so much she is vomiting and her ribs hurt. She has been taking elderberry, she is using MDI more than normal. She has been having some SOB that wakes her from sleep. She used her MDI like 20 mins ago in the car.

## 2022-06-21 ENCOUNTER — Encounter: Payer: Self-pay | Admitting: Adult Health

## 2022-06-21 ENCOUNTER — Ambulatory Visit: Payer: Medicaid Other | Admitting: Adult Health

## 2022-08-16 ENCOUNTER — Ambulatory Visit: Payer: Medicaid Other | Admitting: Adult Health

## 2022-08-23 ENCOUNTER — Telehealth: Payer: Self-pay | Admitting: Neurology

## 2022-08-23 ENCOUNTER — Telehealth: Payer: Medicaid Other | Admitting: Neurology

## 2022-08-23 NOTE — Progress Notes (Unsigned)
PATIENT: Samantha Morse DOB: 10-19-2000  REASON FOR VISIT: follow up HISTORY FROM: patient PRIMARY NEUROLOGIST: Dr. Daisy Blossom  No chief complaint on file.   HISTORY OF PRESENT ILLNESS: Today 08/23/22:  Ms. Samantha Morse is a 21 year old female with a history of migraine headaches.  Migrianes are worse. Reports that she has 3-4 headaches a week. Most of these headaches are severe. Frontal region and around eyes. Denies photophobia and phonophobia. Uses Tylenol for mild headaches. Taking gabapentin as needed. Usually before and after work. Not taking it three times a day. States that she does not take it at work. Takes Robaxin BID usually with gabapentin. Works at [employer]  Has numbness in the right thigh for the last week. Reports that she can hold something in the right or left had for a long period of time because her hands would go numb. At least once a day she will have numbness in the upper extremities.  Patient had nerve conduction studies in July 2022.  She reports that she was having numbness in the hands then.  This study was relatively unremarkable.  08/16/21: Ms Samantha Morse is a 21 year old female with a history of migraines and cervical myofascial muscle pain.  She returns today for follow-up.  She continues on gabapentin 300 mg 3 times a day.  She continues to take Robaxin as needed.  Reports that she probably takes it 3-4 times a week.  She feels that her migraines are under relatively good control.  She may have 1-2 headaches a week.  States that Maxalt works fairly well for her.  She still has dizziness.  States that she may go several weeks with no episodes and then may have a day where she feels dizzy most of the day.  She returns today for an evaluation.  HISTORY (copied from Dr. Trevor Mace note) /18/2022: Migraines and cervical myofascial muscle pain, has had MRI brain and c-spine imaging without any concerning results. She has been going to PT for dry needling and taking  methocarbamol,gabapentin for the myofascial pain and maxalt prn for migraines. On review of her med list, there is also baclofen, flexeril and tramadol on there now (discussed sedation and resp suppression if combining muscle relaxants and other sedating medications). she has not had that many migraines. She has some stiffness and cramping. Her legs will cramp up. She can get some stiffness in her back. It helps to lay flat. She has something to take when she gets migraines so happy with that. They graduated her from PT. Continue methocarbamol and maxalt as needed, gabapentin as needed.   REVIEW OF SYSTEMS: Out of a complete 14 system review of symptoms, the patient complains only of the following symptoms, and all other reviewed systems are negative.  ALLERGIES: Allergies  Allergen Reactions   Bee Venom Other (See Comments)    Red eyes and a stuffy nose (only, per the patient)   Bee Pollen Other (See Comments)    REDNESS OF EYES, STUFFY NOSE   Pollen Extract Other (See Comments)    REDNESS OF EYES, STUFFY NOSE    HOME MEDICATIONS: Outpatient Medications Prior to Visit  Medication Sig Dispense Refill   acetaminophen (TYLENOL) 325 MG tablet Take 2 tablets (650 mg total) by mouth every 6 (six) hours. (Patient taking differently: Take 325-650 mg by mouth See admin instructions. Take 325-650 mg by mouth every 4-6 hours as needed for pain or headaches)     albuterol (PROVENTIL HFA;VENTOLIN HFA) 108 (90 Base) MCG/ACT  inhaler Inhale 1-2 puffs into the lungs every 6 (six) hours as needed for wheezing or shortness of breath. 1 Inhaler 0   albuterol (VENTOLIN HFA) 108 (90 Base) MCG/ACT inhaler Inhale 1-2 puffs into the lungs every 4 (four) hours as needed for wheezing or shortness of breath. 54 g 0   benzonatate (TESSALON) 100 MG capsule Take 1 capsule (100 mg total) by mouth every 8 (eight) hours. 21 capsule 0   dicyclomine (BENTYL) 20 MG tablet Take 1 tablet (20 mg total) by mouth 2 (two) times daily.  (Patient not taking: Reported on 12/13/2021) 20 tablet 0   famotidine (PEPCID) 20 MG tablet Take 1 tablet (20 mg total) by mouth 2 (two) times daily. (Patient not taking: Reported on 12/13/2021) 14 tablet 0   gabapentin (NEURONTIN) 300 MG capsule Take 1 capsule (300 mg total) by mouth 3 (three) times daily. (Patient taking differently: Take 300 mg by mouth 3 (three) times daily as needed (as directed).) 90 capsule 11   gabapentin (NEURONTIN) 300 MG capsule Take 300 mg by mouth 3 (three) times daily.     ibuprofen (ADVIL) 800 MG tablet Take 1 tablet (800 mg total) by mouth every 8 (eight) hours as needed for moderate pain. 21 tablet 0   methocarbamol (ROBAXIN) 500 MG tablet Take 1 tablet (500 mg total) by mouth 3 (three) times daily as needed for muscle spasms. 90 tablet 3   methocarbamol (ROBAXIN) 500 MG tablet Take 500 mg by mouth 3 (three) times daily.     naproxen (NAPROSYN) 500 MG tablet Take 1 tablet (500 mg total) by mouth 2 (two) times daily. (Patient not taking: Reported on 12/13/2021) 30 tablet 0   OMEPRAZOLE PO Take by mouth as needed.     ondansetron (ZOFRAN ODT) 8 MG disintegrating tablet Take 1 tablet (8 mg total) by mouth every 8 (eight) hours as needed for nausea or vomiting. 12 tablet 0   rizatriptan (MAXALT-MLT) 10 MG disintegrating tablet Take 1 tablet (10 mg total) by mouth as needed for migraine. May repeat in 2 hours if needed 9 tablet 11   rizatriptan (MAXALT-MLT) 10 MG disintegrating tablet Take by mouth.     Vitamin D, Ergocalciferol, (DRISDOL) 1.25 MG (50000 UNIT) CAPS capsule Take 50,000 Units by mouth once a week.     No facility-administered medications prior to visit.    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    right   Irregular periods    Hx - now on OCP daily   Migraines    Muscle spasm    Obesity    Pollen allergy    PONV (postoperative nausea and vomiting)     PAST SURGICAL HISTORY: Past Surgical History:  Procedure Laterality Date   APPENDECTOMY     HYDRADENITIS EXCISION Left 04/27/2015   Procedure: EXCISION HIDRADENITIS LEFT AXILLA RYAN [place] ;  Surgeon: Cristine Polio, MD;  Location: [facility];  Service: Plastics;  Laterality: Left;   HYDRADENITIS EXCISION Right 08/10/2015   Procedure: EXCISION HIDRADENITIS RIGHT AXILLA, RYAN [place] [facility]OS[facility];  Surgeon: Cristine Polio, MD;  Location: [facility];  Service: Plastics;  Laterality: Right;   LAPAROSCOPIC APPENDECTOMY N/A 07/05/2021   Procedure: APPENDECTOMY LAPAROSCOPIC;  Surgeon: Louanna Raw  J, MD;  Location: [facility];  Service: General;  Laterality: N/A;   TONSILLECTOMY Bilateral 03/15/2019   Procedure: TONSILLECTOMY;  Surgeon: Helayne Seminole, MD;  [facility];  Service: ENT;  Laterality: Bilateral;   TONSILLECTOMY      FAMILY HISTORY: Family History  Problem Relation Age of Onset   Thyroid disease Mother    Diabetes Mother    Hypertension Mother    Heart disease Mother        MI   Pulmonary embolism Mother    Hernia Mother    Migraines Mother    Cirrhosis Maternal Aunt    Diabetes Maternal Uncle    Hypertension Maternal Grandmother    Kidney cancer Maternal Grandmother    Diabetes Maternal Grandfather    Hypertension Maternal Grandfather     SOCIAL HISTORY: Social History   Socioeconomic History   Marital status: Single    Spouse name: Not on file   Number of children: 0   Years of education: Not on file   Highest education level: Some college, no degree  Occupational History   Not on file  Tobacco Use   Smoking status: Never   Smokeless tobacco: Never  Vaping Use   Vaping Use: Never used  Substance and Sexual Activity   Alcohol use: Yes    Comment:  socially   Drug use: Yes    Types: Marijuana    Comment: daily   Sexual activity: Yes    Birth control/protection: None, Pill  Other Topics Concern   Not on file  Social History Narrative   ** Merged History Encounter **       Lives with sig other   Social Determinants of Health   Financial Resource Strain: Not on file  Food Insecurity: Not on file  Transportation Needs: Not on file  Physical Activity: Not on file  Stress: Not on file  Social Connections: Not on file  Intimate Partner Violence: Not on file      PHYSICAL EXAM  There were no vitals filed for this visit.   There is no height or weight on file to calculate BMI.  Generalized: Well developed, in no acute distress   Neurological examination  Mentation: Alert oriented to time, place, history taking. Follows all commands speech and language fluent Cranial nerve II-XII: Pupils were equal round reactive to light. Extraocular movements were full, visual field were full on confrontational test. Facial sensation and strength were normal. Uvula tongue midline. Head turning and shoulder shrug  were normal and symmetric. Motor: The motor testing reveals 5 over 5 strength of all 4 extremities. Good symmetric motor tone is noted throughout.  Sensory: Sensory testing is intact to soft touch on all 4 extremities. No evidence of extinction is noted.  Pinprick sensation decreased in the right thigh and the anterior lateral portion consistent with meralgia paresthetica Coordination: Cerebellar testing reveals good finger-nose-finger and heel-to-shin bilaterally.  Gait and station: Gait is normal.  Reflexes: Deep tendon reflexes are symmetric and normal bilaterally.   DIAGNOSTIC DATA (LABS, IMAGING, TESTING) - I reviewed patient records, labs, notes, testing and imaging myself where available.  Lab Results  Component Value Date   WBC 7.6 06/16/2021   HGB 12.2 06/16/2021   HCT 35.9 (L) 06/16/2021   MCV 85.5 06/16/2021    PLT 270 06/16/2021      Component Value Date/Time   NA 139 06/16/2021 0237   NA 142 03/19/2021 1148   K 4.6 06/16/2021 0237   CL 107  06/16/2021 0237   CO2 20 (L) 06/16/2021 0237   GLUCOSE 98 06/16/2021 0237   BUN 6 06/16/2021 0237   BUN 7 03/19/2021 1148   CREATININE 0.62 06/16/2021 0237   CALCIUM 8.8 (L) 06/16/2021 0237   PROT 6.2 (L) 06/16/2021 0237   PROT 6.6 03/24/2021 1138   ALBUMIN 3.1 (L) 06/16/2021 0237   ALBUMIN 4.0 03/19/2021 1148   AST 27 06/16/2021 0237   ALT 28 06/16/2021 0237   ALKPHOS 62 06/16/2021 0237   BILITOT 0.7 06/16/2021 0237   BILITOT 0.6 03/19/2021 1148   GFRNONAA >60 06/16/2021 0237   GFRAA >60 03/22/2019 0046    Lab Results  Component Value Date   HGBA1C 5.0 03/24/2021   Lab Results  Component Value Date   VITAMINB12 423 03/24/2021   Lab Results  Component Value Date   TSH 1.800 03/19/2021      ASSESSMENT AND PLAN 21 y.o. year old female  has a past medical history of Asthma, Asthma, Complication of anesthesia (04/27/2015), GERD (gastroesophageal reflux disease), Hidradenitis axillaris (07/2015), Irregular periods, Migraines, Muscle spasm, Obesity, Pollen allergy, and PONV (postoperative nausea and vomiting). here with :  Migraine headaches  Continue Maxalt for abortive therapy Encouraged the patient to take gabapentin 300 mg 3 times a day to see if this offers any benefit with her headaches  2.  Myofascial pain  Continue Robaxin 500 mg 3 times a day as needed.  3.  Meralgia paresthetica on the right  Discussed weight loss We will continue to monitor symptoms  Follow-up in 6 months or sooner if needed    Butch Penny, MSN, NP-C 08/23/2022, 11:21 AM [facility] [address], [address] [place], [place] [address] [phone]

## 2022-08-23 NOTE — Telephone Encounter (Signed)
Unable to reach pt over the phone, sent mychart msg informing pt of appointment change

## 2022-09-07 ENCOUNTER — Telehealth: Payer: Self-pay | Admitting: Neurology

## 2022-09-07 ENCOUNTER — Ambulatory Visit: Payer: Medicaid Other | Admitting: Neurology

## 2022-09-07 NOTE — Telephone Encounter (Signed)
Mychart message reschedule

## 2022-09-26 ENCOUNTER — Telehealth: Payer: Self-pay | Admitting: Neurology

## 2022-09-26 ENCOUNTER — Other Ambulatory Visit: Payer: Self-pay | Admitting: Neurology

## 2022-09-26 ENCOUNTER — Ambulatory Visit: Payer: Medicaid Other | Admitting: Neurology

## 2022-09-26 MED ORDER — METHOCARBAMOL 500 MG PO TABS: 500.0000 mg | ORAL_TABLET | Freq: Three times a day (TID) | ORAL | 3 refills | Status: DC | PRN

## 2022-09-26 MED ORDER — GABAPENTIN 300 MG PO CAPS: 300.0000 mg | ORAL_CAPSULE | Freq: Three times a day (TID) | ORAL | 3 refills | Status: DC | PRN

## 2022-09-26 MED ORDER — RIZATRIPTAN BENZOATE 10 MG PO TBDP: 10.0000 mg | ORAL_TABLET | ORAL | 11 refills | Status: DC | PRN

## 2022-09-26 NOTE — Telephone Encounter (Signed)
Pt is requesting a refill for her migraine medication and pt stated she is feeling better and will call back within the next few weeks to schedule an appointment with Dr. Jaynee Eagles if she feels it is necessary

## 2022-09-26 NOTE — Progress Notes (Deleted)
PATIENT: Samantha Morse DOB: [birth year]  REASON FOR VISIT: follow up HISTORY FROM: patient PRIMARY NEUROLOGIST: Dr. Lavell Anchors  No chief complaint on file.   HISTORY OF PRESENT ILLNESS: Today 08/23/2022:  Ms. Samantha Morse is a 22 year old female with a history of migraine headaches.  Migrianes are worse. Reports that she has 3-4 headaches a week. Most of these headaches are severe. Frontal region and around eyes. Denies photophobia and phonophobia. Uses Tylenol for mild headaches. Taking gabapentin as needed. Usually before and after work. Not taking it three times a day. States that she does not take it at work. Takes Robaxin BID usually with gabapentin. Works at [employer]  Has numbness in the right thigh for the last week. Reports that she can hold something in the right or left had for a long period of time because her hands would go numb. At least once a day she will have numbness in the upper extremities.  Patient had nerve conduction studies in July 2022.  She reports that she was having numbness in the hands then.  This study was relatively unremarkable.  08/16/21: Ms Samantha Morse is a 22 year old female with a history of migraines and cervical myofascial muscle pain.  She returns today for follow-up.  She continues on gabapentin 300 mg 3 times a day.  She continues to take Robaxin as needed.  Reports that she probably takes it 3-4 times a week.  She feels that her migraines are under relatively good control.  She may have 1-2 headaches a week.  States that Maxalt works fairly well for her.  She still has dizziness.  States that she may go several weeks with no episodes and then may have a day where she feels dizzy most of the day.  She returns today for an evaluation.  HISTORY (copied from Dr. Cathren Laine note) /18/2022: Migraines and cervical myofascial muscle pain, has had MRI brain and c-spine imaging without any concerning results. She has been going to PT for dry needling and taking  methocarbamol,gabapentin for the myofascial pain and maxalt prn for migraines. On review of her med list, there is also baclofen, flexeril and tramadol on there now (discussed sedation and resp suppression if combining muscle relaxants and other sedating medications). she has not had that many migraines. She has some stiffness and cramping. Her legs will cramp up. She can get some stiffness in her back. It helps to lay flat. She has something to take when she gets migraines so happy with that. They graduated her from PT. Continue methocarbamol and maxalt as needed, gabapentin as needed.   REVIEW OF SYSTEMS: Out of a complete 14 system review of symptoms, the patient complains only of the following symptoms, and all other reviewed systems are negative.  ALLERGIES: Allergies  Allergen Reactions   Bee Venom Other (See Comments)    Red eyes and a stuffy nose (only, per the patient)   Bee Pollen Other (See Comments)    REDNESS OF EYES, STUFFY NOSE   Pollen Extract Other (See Comments)    REDNESS OF EYES, STUFFY NOSE    HOME MEDICATIONS: Outpatient Medications Prior to Visit  Medication Sig Dispense Refill   acetaminophen (TYLENOL) 325 MG tablet Take 2 tablets (650 mg total) by mouth every 6 (six) hours. (Patient taking differently: Take 325-650 mg by mouth See admin instructions. Take 325-650 mg by mouth every 4-6 hours as needed for pain or headaches)     albuterol (PROVENTIL HFA;VENTOLIN HFA) 108 (90 Base) MCG/ACT  inhaler Inhale 1-2 puffs into the lungs every 6 (six) hours as needed for wheezing or shortness of breath. 1 Inhaler 0   albuterol (VENTOLIN HFA) 108 (90 Base) MCG/ACT inhaler Inhale 1-2 puffs into the lungs every 4 (four) hours as needed for wheezing or shortness of breath. 54 g 0   benzonatate (TESSALON) 100 MG capsule Take 1 capsule (100 mg total) by mouth every 8 (eight) hours. 21 capsule 0   dicyclomine (BENTYL) 20 MG tablet Take 1 tablet (20 mg total) by mouth 2 (two) times daily.  (Patient not taking: Reported on 12/13/2021) 20 tablet 0   famotidine (PEPCID) 20 MG tablet Take 1 tablet (20 mg total) by mouth 2 (two) times daily. (Patient not taking: Reported on 12/13/2021) 14 tablet 0   gabapentin (NEURONTIN) 300 MG capsule Take 1 capsule (300 mg total) by mouth 3 (three) times daily. (Patient taking differently: Take 300 mg by mouth 3 (three) times daily as needed (as directed).) 90 capsule 11   gabapentin (NEURONTIN) 300 MG capsule Take 300 mg by mouth 3 (three) times daily.     ibuprofen (ADVIL) 800 MG tablet Take 1 tablet (800 mg total) by mouth every 8 (eight) hours as needed for moderate pain. 21 tablet 0   methocarbamol (ROBAXIN) 500 MG tablet Take 1 tablet (500 mg total) by mouth 3 (three) times daily as needed for muscle spasms. 90 tablet 3   methocarbamol (ROBAXIN) 500 MG tablet Take 500 mg by mouth 3 (three) times daily.     naproxen (NAPROSYN) 500 MG tablet Take 1 tablet (500 mg total) by mouth 2 (two) times daily. (Patient not taking: Reported on 12/13/2021) 30 tablet 0   OMEPRAZOLE PO Take by mouth as needed.     ondansetron (ZOFRAN ODT) 8 MG disintegrating tablet Take 1 tablet (8 mg total) by mouth every 8 (eight) hours as needed for nausea or vomiting. 12 tablet 0   rizatriptan (MAXALT-MLT) 10 MG disintegrating tablet Take 1 tablet (10 mg total) by mouth as needed for migraine. May repeat in 2 hours if needed 9 tablet 11   rizatriptan (MAXALT-MLT) 10 MG disintegrating tablet Take by mouth.     Vitamin D, Ergocalciferol, (DRISDOL) 1.25 MG (50000 UNIT) CAPS capsule Take 50,000 Units by mouth once a week.     No facility-administered medications prior to visit.    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    right   Irregular periods    Hx - now on OCP daily   Migraines    Muscle spasm    Obesity    Pollen allergy    PONV (postoperative nausea and vomiting)     PAST SURGICAL HISTORY: Past Surgical History:  Procedure Laterality Date   APPENDECTOMY     HYDRADENITIS EXCISION Left 04/27/2015   Procedure: EXCISION HIDRADENITIS LEFT AXILLA RYAN [place] ;  Surgeon: Cristine Polio, MD;  Location: [facility];  Service: Plastics;  Laterality: Left;   HYDRADENITIS EXCISION Right 08/10/2015   Procedure: EXCISION HIDRADENITIS RIGHT AXILLA, RYAN [place] CLOSURE;  Surgeon: Cristine Polio, MD;  Location: [facility];  Service: Plastics;  Laterality: Right;   LAPAROSCOPIC APPENDECTOMY N/A 07/05/2021   Procedure: APPENDECTOMY LAPAROSCOPIC;  Surgeon: Louanna Raw  J, MD;  Location: [facility];  Service: General;  Laterality: N/A;   TONSILLECTOMY Bilateral 03/15/2019   Procedure: TONSILLECTOMY;  Surgeon: Helayne Seminole, MD;  Location[facility];  Service: ENT;  Laterality: Bilateral;   TONSILLECTOMY      FAMILY HISTORY: Family History  Problem Relation Age of Onset   Thyroid disease Mother    Diabetes Mother    Hypertension Mother    Heart disease Mother        MI   Pulmonary embolism Mother    Hernia Mother    Migraines Mother    Cirrhosis Maternal Aunt    Diabetes Maternal Uncle    Hypertension Maternal Grandmother    Kidney cancer Maternal Grandmother    Diabetes Maternal Grandfather    Hypertension Maternal Grandfather     SOCIAL HISTORY: Social History   Socioeconomic History   Marital status: Single    Spouse name: Not on file   Number of children: 0   Years of education: Not on file   Highest education level: Some college, no degree  Occupational History   Not on file  Tobacco Use   Smoking status: Never   Smokeless tobacco: Never  Vaping Use   Vaping Use: Never used  Substance and Sexual Activity   Alcohol use: Yes    Comment:  socially   Drug use: Yes    Types: Marijuana    Comment: daily   Sexual activity: Yes    Birth control/protection: None, Pill  Other Topics Concern   Not on file  Social History Narrative   ** Merged History Encounter **       Lives with sig other   Social Determinants of Health   Financial Resource Strain: Not on file  Food Insecurity: Not on file  Transportation Needs: Not on file  Physical Activity: Not on file  Stress: Not on file  Social Connections: Not on file  Intimate Partner Violence: Not on file      PHYSICAL EXAM  There were no vitals filed for this visit.   There is no height or weight on file to calculate BMI.  Generalized: Well developed, in no acute distress   Neurological examination  Mentation: Alert oriented to time, place, history taking. Follows all commands speech and language fluent Cranial nerve II-XII: Pupils were equal round reactive to light. Extraocular movements were full, visual field were full on confrontational test. Facial sensation and strength were normal. Uvula tongue midline. Head turning and shoulder shrug  were normal and symmetric. Motor: The motor testing reveals 5 over 5 strength of all 4 extremities. Good symmetric motor tone is noted throughout.  Sensory: Sensory testing is intact to soft touch on all 4 extremities. No evidence of extinction is noted.  Pinprick sensation decreased in the right thigh and the anterior lateral portion consistent with meralgia paresthetica Coordination: Cerebellar testing reveals good finger-nose-finger and heel-to-shin bilaterally.  Gait and station: Gait is normal.  Reflexes: Deep tendon reflexes are symmetric and normal bilaterally.   DIAGNOSTIC DATA (LABS, IMAGING, TESTING) - I reviewed patient records, labs, notes, testing and imaging myself where available.  Lab Results  Component Value Date   WBC 7.6 06/16/2021   HGB 12.2 06/16/2021   HCT 35.9 (L) 06/16/2021   MCV 85.5 06/16/2021    PLT 270 06/16/2021      Component Value Date/Time   NA 139 06/16/2021 0237   NA 142 03/19/2021 1148   K 4.6 06/16/2021 0237   CL 107  06/16/2021 0237   CO2 20 (L) 06/16/2021 0237   GLUCOSE 98 06/16/2021 0237   BUN 6 06/16/2021 0237   BUN 7 03/19/2021 1148   CREATININE 0.62 06/16/2021 0237   CALCIUM 8.8 (L) 06/16/2021 0237   PROT 6.2 (L) 06/16/2021 0237   PROT 6.6 03/24/2021 1138   ALBUMIN 3.1 (L) 06/16/2021 0237   ALBUMIN 4.0 03/19/2021 1148   AST 27 06/16/2021 0237   ALT 28 06/16/2021 0237   ALKPHOS 62 06/16/2021 0237   BILITOT 0.7 06/16/2021 0237   BILITOT 0.6 03/19/2021 1148   GFRNONAA >60 06/16/2021 0237   GFRAA >60 03/22/2019 0046    Lab Results  Component Value Date   HGBA1C 5.0 03/24/2021   Lab Results  Component Value Date   [id number] 03/24/2021   Lab Results  Component Value Date   TSH 1.800 03/19/2021      ASSESSMENT AND PLAN 22 y.o. year old female  has a past medical history of Asthma, Asthma, Complication of anesthesia (04/27/2015), GERD (gastroesophageal reflux disease), Hidradenitis axillaris (07/2015), Irregular periods, Migraines, Muscle spasm, Obesity, Pollen allergy, and PONV (postoperative nausea and vomiting). here with :  Migraine headaches  Continue Maxalt for abortive therapy Encouraged the patient to take gabapentin 300 mg 3 times a day to see if this offers any benefit with her headaches  2.  Myofascial pain  Continue Robaxin 500 mg 3 times a day as needed.  3.  Meralgia paresthetica on the right  Discussed weight loss We will continue to monitor symptoms  Follow-up in 6 months or sooner if needed    Ward Givens, MSN, NP-C 09/26/2022, 1:09 PM [facility] [address] [place], [place] [address] [phone]

## 2022-10-06 DIAGNOSIS — H5213 Myopia, bilateral: Secondary | ICD-10-CM | POA: Diagnosis not present

## 2022-10-18 DIAGNOSIS — L0291 Cutaneous abscess, unspecified: Secondary | ICD-10-CM | POA: Diagnosis not present

## 2022-12-05 DIAGNOSIS — L0211 Cutaneous abscess of neck: Secondary | ICD-10-CM | POA: Diagnosis not present

## 2022-12-05 DIAGNOSIS — R062 Wheezing: Secondary | ICD-10-CM | POA: Diagnosis not present

## 2022-12-05 DIAGNOSIS — R030 Elevated blood-pressure reading, without diagnosis of hypertension: Secondary | ICD-10-CM | POA: Diagnosis not present

## 2022-12-05 DIAGNOSIS — N912 Amenorrhea, unspecified: Secondary | ICD-10-CM | POA: Diagnosis not present

## 2022-12-28 ENCOUNTER — Telehealth: Payer: Medicaid Other | Admitting: Physician Assistant

## 2022-12-28 DIAGNOSIS — Q845 Enlarged and hypertrophic nails: Secondary | ICD-10-CM | POA: Diagnosis not present

## 2022-12-28 DIAGNOSIS — B351 Tinea unguium: Secondary | ICD-10-CM

## 2022-12-28 DIAGNOSIS — L603 Nail dystrophy: Secondary | ICD-10-CM

## 2022-12-28 DIAGNOSIS — L608 Other nail disorders: Secondary | ICD-10-CM | POA: Diagnosis not present

## 2022-12-28 NOTE — Progress Notes (Signed)
Because you are dealing with nail trauma and a nail fungal infection which is harder to treat than an athlete's foot (skin infection) and requires ongoing oral medication and lab testing, I feel your condition warrants further evaluation and I recommend that you be seen in a face to face visit.   NOTE: There will be NO CHARGE for this eVisit   If you are having a true medical emergency please call 911.      For an urgent face to face visit, [facility] has eight urgent care centers for your convenience:   NEW!! [facility] at [facility] Get Driving Directions [phone] [address], [address] [place], [address]    [facility] at [facility] Get Driving Directions [phone] [address] [place], [place] [address]   [facility]) Get Driving Directions [phone] [address] [place], [place] [address]  [facility]) Get Driving Directions [phone] [address] [place],  [place]  [address]  [facility] at [employer]  [phone] [address] [place],  [place] [address]   [facility] at [facility] Get Driving Directions [phone] [address] [place], [place] [address]   [facility] at [facility] Get Driving Directions  [phone] [address] [place], [place] [address]   [facility] at [facility] Directions [phone] [address] [place], [place] [address]  Your MyChart E-visit questionnaire answers were reviewed by a board certified advanced clinical practitioner to complete your personal care plan based on your specific symptoms.  Thank you for using e-Visits.

## 2023-02-09 ENCOUNTER — Ambulatory Visit: Payer: Medicaid Other | Admitting: Podiatry

## 2023-02-09 ENCOUNTER — Encounter: Payer: Self-pay | Admitting: Podiatry

## 2023-02-09 DIAGNOSIS — R079 Chest pain, unspecified: Secondary | ICD-10-CM | POA: Diagnosis not present

## 2023-02-09 DIAGNOSIS — B351 Tinea unguium: Secondary | ICD-10-CM

## 2023-02-09 DIAGNOSIS — M79676 Pain in unspecified toe(s): Secondary | ICD-10-CM | POA: Diagnosis not present

## 2023-02-09 DIAGNOSIS — F4322 Adjustment disorder with anxiety: Secondary | ICD-10-CM | POA: Diagnosis not present

## 2023-02-09 DIAGNOSIS — L603 Nail dystrophy: Secondary | ICD-10-CM

## 2023-02-09 DIAGNOSIS — Z13220 Encounter for screening for lipoid disorders: Secondary | ICD-10-CM | POA: Diagnosis not present

## 2023-02-09 DIAGNOSIS — R7309 Other abnormal glucose: Secondary | ICD-10-CM | POA: Diagnosis not present

## 2023-02-09 DIAGNOSIS — R7989 Other specified abnormal findings of blood chemistry: Secondary | ICD-10-CM | POA: Diagnosis not present

## 2023-02-09 DIAGNOSIS — I10 Essential (primary) hypertension: Secondary | ICD-10-CM | POA: Diagnosis not present

## 2023-02-09 DIAGNOSIS — R0789 Other chest pain: Secondary | ICD-10-CM | POA: Diagnosis not present

## 2023-02-09 DIAGNOSIS — L732 Hidradenitis suppurativa: Secondary | ICD-10-CM | POA: Diagnosis not present

## 2023-02-09 DIAGNOSIS — Z6841 Body Mass Index (BMI) 40.0 and over, adult: Secondary | ICD-10-CM | POA: Diagnosis not present

## 2023-02-09 DIAGNOSIS — L0211 Cutaneous abscess of neck: Secondary | ICD-10-CM | POA: Diagnosis not present

## 2023-02-09 DIAGNOSIS — L600 Ingrowing nail: Secondary | ICD-10-CM

## 2023-02-09 MED ORDER — NEOMYCIN-POLYMYXIN-HC 1 % OT SOLN: OTIC | 1 refills | Status: DC

## 2023-02-09 NOTE — Progress Notes (Signed)
Subjective:  Patient ID: Samantha Morse, female    DOB: 06-Jun-2001,  MRN: [id number] HPI Chief Complaint  Patient presents with   Toe Pain    Hallux bilateral (L>R) - thick, dark, incurvated nails, previous ingrown procedure, very tender   New Patient (Initial Visit)    Est pt 2019    22 y.o. female presents with the above complaint.   ROS: Denies fever chills nausea vomit muscle aches pains calf pain back pain chest pain shortness of breath.  Past Medical History:  Diagnosis Date   Asthma    prn inhaler   Asthma    Complication of anesthesia 04/27/2015   vomiting and laryngospasm on extubation, requiring CPAP, jaw thrust and succinylcholine   GERD (gastroesophageal reflux disease)    Hidradenitis axillaris 07/2015   right   Irregular periods    Hx - now on OCP daily   Migraines    Muscle spasm    Obesity    Pollen allergy    PONV (postoperative nausea and vomiting)    Past Surgical History:  Procedure Laterality Date   APPENDECTOMY     HYDRADENITIS EXCISION Left 04/27/2015   Procedure: EXCISION HIDRADENITIS LEFT AXILLA [name] CLOSURE ;  Surgeon: Louisa Second, MD;  Location: [facility];  Service: Plastics;  Laterality: Left;   HYDRADENITIS EXCISION Right 08/10/2015   Procedure: EXCISION HIDRADENITIS RIGHT AXILLA, RYAN POLLOCK CLOSURE;  Surgeon: Louisa Second, MD;  Location: [facility];  Service: Plastics;  Laterality: Right;   LAPAROSCOPIC APPENDECTOMY N/A 07/05/2021   Procedure: APPENDECTOMY LAPAROSCOPIC;  Surgeon: Quentin Ore, MD;  Location: [facility];  Service: General;  Laterality: N/A;   TONSILLECTOMY Bilateral 03/15/2019   Procedure: TONSILLECTOMY;  Surgeon: Graylin Shiver, MD;  Location: [facility];  Service: ENT;  Laterality: Bilateral;   TONSILLECTOMY      Current Outpatient Medications:    albuterol (VENTOLIN HFA) 108 (90 Base) MCG/ACT inhaler, Inhale 1-2 puffs into the lungs every 4 (four) hours as needed for  wheezing or shortness of breath., Disp: 54 g, Rfl: 0   benzonatate (TESSALON) 100 MG capsule, Take 1 capsule (100 mg total) by mouth every 8 (eight) hours., Disp: 21 capsule, Rfl: 0   ciclopirox (PENLAC) 8 % solution, Apply topically at bedtime., Disp: , Rfl:    dicyclomine (BENTYL) 20 MG tablet, Take 1 tablet (20 mg total) by mouth 2 (two) times daily. (Patient not taking: Reported on 12/13/2021), Disp: 20 tablet, Rfl: 0   famotidine (PEPCID) 20 MG tablet, Take 1 tablet (20 mg total) by mouth 2 (two) times daily. (Patient not taking: Reported on 12/13/2021), Disp: 14 tablet, Rfl: 0   gabapentin (NEURONTIN) 300 MG capsule, Take 1 capsule (300 mg total) by mouth 3 (three) times daily as needed., Disp: 270 capsule, Rfl: 3   gabapentin (NEURONTIN) 300 MG capsule, Take 300 mg by mouth 3 (three) times daily., Disp: , Rfl:    methocarbamol (ROBAXIN) 500 MG tablet, Take 1 tablet (500 mg total) by mouth 3 (three) times daily as needed for muscle spasms., Disp: 270 tablet, Rfl: 3   methocarbamol (ROBAXIN) 500 MG tablet, Take 500 mg by mouth 3 (three) times daily., Disp: , Rfl:    naproxen (NAPROSYN) 500 MG tablet, Take 1 tablet (500 mg total) by mouth 2 (two) times daily. (Patient not taking: Reported on 12/13/2021), Disp: 30 tablet, Rfl: 0   OMEPRAZOLE PO, Take by mouth as needed., Disp: , Rfl:    ondansetron (ZOFRAN ODT) 8  MG disintegrating tablet, Take 1 tablet (8 mg total) by mouth every 8 (eight) hours as needed for nausea or vomiting., Disp: 12 tablet, Rfl: 0   rizatriptan (MAXALT-MLT) 10 MG disintegrating tablet, Take 1 tablet (10 mg total) by mouth as needed for migraine. May repeat in 2 hours if needed, Disp: 9 tablet, Rfl: 11   rizatriptan (MAXALT-MLT) 10 MG disintegrating tablet, Take by mouth., Disp: , Rfl:    Vitamin D, Ergocalciferol, (DRISDOL) 1.25 MG (50000 UNIT) CAPS capsule, Take 50,000 Units by mouth once a week., Disp: , Rfl:   Allergies  Allergen Reactions   Bee Venom Other (See Comments)     Red eyes and a stuffy nose (only, per the patient)   Bee Pollen Other (See Comments)    REDNESS OF EYES, STUFFY NOSE   Pollen Extract Other (See Comments)    REDNESS OF EYES, STUFFY NOSE   Review of Systems Objective:  There were no vitals filed for this visit.  General: Well developed, nourished, in no acute distress, alert and oriented x3   Dermatological: Skin is warm, dry and supple bilateral. Nails x 10 are well maintained; remaining integument appears unremarkable at this time. There are no open sores, no preulcerative lesions, no rash or signs of infection present.  Sharp incurvated nail margins of the hallux bilaterally with onychocryptosis.  Exquisitely tender on palpation.  Vascular: Dorsalis Pedis artery and Posterior Tibial artery pedal pulses are 2/4 bilateral with immedate capillary fill time. Pedal hair growth present. No varicosities and no lower extremity edema present bilateral.   Neruologic: Grossly intact via light touch bilateral. Vibratory intact via tuning fork bilateral. Protective threshold with Semmes Wienstein monofilament intact to all pedal sites bilateral. Patellar and Achilles deep tendon reflexes 2+ bilateral. No Babinski or clonus noted bilateral.   Musculoskeletal: No gross boney pedal deformities bilateral. No pain, crepitus, or limitation noted with foot and ankle range of motion bilateral. Muscular strength 5/5 in all groups tested bilateral.  Gait: Unassisted, Nonantalgic.    Radiographs:  None taken  Assessment & Plan:   Assessment: Painful mycotic nails.  Plan: Debridement of toe and is 1 through 5 bilateral.     Alawna Graybeal T. [place], [place]

## 2023-02-10 DIAGNOSIS — R7989 Other specified abnormal findings of blood chemistry: Secondary | ICD-10-CM | POA: Diagnosis not present

## 2023-02-10 DIAGNOSIS — I10 Essential (primary) hypertension: Secondary | ICD-10-CM | POA: Diagnosis not present

## 2023-02-10 DIAGNOSIS — Z13220 Encounter for screening for lipoid disorders: Secondary | ICD-10-CM | POA: Diagnosis not present

## 2023-02-10 DIAGNOSIS — R0789 Other chest pain: Secondary | ICD-10-CM | POA: Diagnosis not present

## 2023-07-10 ENCOUNTER — Ambulatory Visit (HOSPITAL_COMMUNITY): Payer: Medicaid Other

## 2023-07-10 ENCOUNTER — Ambulatory Visit (INDEPENDENT_AMBULATORY_CARE_PROVIDER_SITE_OTHER): Payer: Medicaid Other

## 2023-07-10 ENCOUNTER — Ambulatory Visit (HOSPITAL_COMMUNITY)
Admission: EM | Admit: 2023-07-10 | Discharge: 2023-07-10 | Disposition: A | Discharge status: Home / Self Care | Payer: Medicaid Other | Attending: Family Medicine | Admitting: Family Medicine

## 2023-07-10 ENCOUNTER — Encounter (HOSPITAL_COMMUNITY): Payer: Self-pay | Admitting: Emergency Medicine

## 2023-07-10 DIAGNOSIS — M25562 Pain in left knee: Secondary | ICD-10-CM

## 2023-07-10 MED ORDER — NAPROXEN 500 MG PO TABS: 500.0000 mg | ORAL_TABLET | Freq: Two times a day (BID) | ORAL | 0 refills | Status: DC | PRN

## 2023-07-10 NOTE — ED Triage Notes (Signed)
Patient states that she has been having left knee pain x 1 week.  No injury.  Patient does stand at work in [employer].  Patient applying more of her weight to her right knee which is starting to hurt as well.  Patient has taken Tylenol, Methocarbamol and Gabapentin.

## 2023-07-10 NOTE — Discharge Instructions (Signed)
By my review your x-ray does not show any broken bones. The radiologist will also read your x-ray, and if their interpretation differs significantly from mine, we will call you.  Take naproxen 500 mg--1 tablet every 12 hours as needed for pain  Please restart taking your blood pressure medicine and follow-up with your primary care.

## 2023-07-10 NOTE — ED Provider Notes (Signed)
[facility]    CSN: [id number] Arrival date & time: 07/10/23  1828      History   Chief Complaint Chief Complaint  Patient presents with   Knee Pain    HPI Samantha Morse is a 22 y.o. female.    Knee Pain Here for left knee pain this been going on for about 1 week.  No trauma or fall.  No fever or rash  There is maybe been some swelling in the knee but not lower.  Her blood pressure is elevated today at 173/110.  She states she is not taking her blood pressure medication as directed    Past Medical History:  Diagnosis Date   Asthma    prn inhaler   Asthma    Complication of anesthesia 04/27/2015   vomiting and laryngospasm on extubation, requiring CPAP, jaw thrust and succinylcholine   GERD (gastroesophageal reflux disease)    Hidradenitis axillaris 07/2015   right   Irregular periods    Hx - now on OCP daily   Migraines    Muscle spasm    Obesity    Pollen allergy    PONV (postoperative nausea and vomiting)     Patient Active Problem List   Diagnosis Date Noted   Acute appendicitis 06/15/2021   Migraine without aura and without status migrainosus, not intractable 01/07/2021   Cervical myofascial pain syndrome 01/07/2021   Neck pain 09/30/2020   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   Peritonsillar abscess 02/18/2019   Snoring 05/29/2018   Obesity 06/28/2016   Hidradenitis suppurativa 06/28/2016   Amenorrhea 06/28/2016    Past Surgical History:  Procedure Laterality Date   APPENDECTOMY     HYDRADENITIS EXCISION Left 04/27/2015   Procedure: EXCISION HIDRADENITIS LEFT AXILLA [name] CLOSURE ;  Surgeon: Louisa Second, MD;  Location: [facility];  Service: Plastics;  Laterality: Left;   HYDRADENITIS EXCISION Right 08/10/2015   Procedure: EXCISION HIDRADENITIS RIGHT AXILLA, RYAN POLLOCK CLOSURE;  Surgeon: Louisa Second, MD;  Location: [facility];  Service: Plastics;  Laterality: Right;   LAPAROSCOPIC  APPENDECTOMY N/A 07/05/2021   Procedure: APPENDECTOMY LAPAROSCOPIC;  Surgeon: Quentin Ore, MD;  Location: [facility];  Service: General;  Laterality: N/A;   TONSILLECTOMY Bilateral 03/15/2019   Procedure: TONSILLECTOMY;  Surgeon: Graylin Shiver, MD;  [facility];  Service: ENT;  Laterality: Bilateral;   TONSILLECTOMY      OB History   No obstetric history on file.      Home Medications    Prior to Admission medications   Medication Sig Start Date End Date Taking? Authorizing Provider  albuterol (VENTOLIN HFA) 108 (90 Base) MCG/ACT inhaler Inhale 1-2 puffs into the lungs every 4 (four) hours as needed for wheezing or shortness of breath. 05/24/22  Yes Carlisle Beers, FNP  gabapentin (NEURONTIN) 300 MG capsule Take 1 capsule (300 mg total) by mouth 3 (three) times daily as needed. 09/26/22  Yes Anson Fret, MD  gabapentin (NEURONTIN) 300 MG capsule Take 300 mg by mouth 3 (three) times daily. 03/27/22  Yes [provider]  methocarbamol (ROBAXIN) 500 MG tablet Take 1 tablet (500 mg total) by mouth 3 (three) times daily as needed for muscle spasms. 09/26/22  Yes Anson Fret, MD  methocarbamol (ROBAXIN) 500 MG tablet Take 500 mg by mouth 3 (three) times daily. 03/28/22  Yes [provider]  naproxen (NAPROSYN) 500 MG tablet Take 1 tablet (500 mg total) by mouth 2 (two) times  daily as needed (pain). 07/10/23  Yes Jalei Shibley, Janace Aris, MD  Vitamin D, Ergocalciferol, (DRISDOL) 1.25 MG (50000 UNIT) CAPS capsule Take 50,000 Units by mouth once a week. 03/04/22  Yes [provider]  ciclopirox (PENLAC) 8 % solution Apply topically at bedtime.    [provider]  dicyclomine (BENTYL) 20 MG tablet Take 1 tablet (20 mg total) by mouth 2 (two) times daily. Patient not taking: Reported on 12/13/2021 06/11/21   Linwood Dibbles, MD  famotidine (PEPCID) 20 MG tablet Take 1 tablet (20 mg total) by mouth 2 (two) times daily. Patient not taking: Reported on  12/13/2021 06/11/21   Linwood Dibbles, MD  OMEPRAZOLE PO Take by mouth as needed.    [provider]  rizatriptan (MAXALT-MLT) 10 MG disintegrating tablet Take 1 tablet (10 mg total) by mouth as needed for migraine. May repeat in 2 hours if needed 09/26/22   Anson Fret, MD  rizatriptan (MAXALT-MLT) 10 MG disintegrating tablet Take by mouth. 11/29/21   [provider]    Family History Family History  Problem Relation Age of Onset   Thyroid disease Mother    Diabetes Mother    Hypertension Mother    Heart disease Mother        MI   Pulmonary embolism Mother    Hernia Mother    Migraines Mother    Hypertension Maternal Grandmother    Kidney cancer Maternal Grandmother    Diabetes Maternal Grandfather    Hypertension Maternal Grandfather    Cirrhosis Maternal Aunt    Diabetes Maternal Uncle     Social History Social History   Tobacco Use   Smoking status: Never   Smokeless tobacco: Never  Vaping Use   Vaping status: Never Used  Substance Use Topics   Alcohol use: Yes    Comment: socially   Drug use: Yes    Types: Marijuana    Comment: daily     Allergies   Bee venom, Bee pollen, and Pollen extract   Review of Systems Review of Systems   Physical Exam Triage Vital Signs ED Triage Vitals  Encounter Vitals Group     BP 07/10/23 1910 (!) 173/110     Systolic BP Percentile --      Diastolic BP Percentile --      Pulse Rate 07/10/23 1910 81     Resp 07/10/23 1910 16     Temp 07/10/23 1910 98.2 F (36.8 C)     Temp Source 07/10/23 1910 Oral     SpO2 07/10/23 1910 98 %     Weight 07/10/23 1912 (!) 330 lb (149.7 kg)     Height 07/10/23 1912 5\' 4"  (1.626 m)     Head Circumference --      Peak Flow --      Pain Score 07/10/23 1912 10     Pain Loc --      Pain Education --      Exclude from Growth Chart --    No data found.  Updated Vital Signs BP (!) 173/110 (BP Location: Left Arm)   Pulse 81   Temp 98.2 F (36.8 C) (Oral)   Resp 16    Ht 5\' 4"  (1.626 m)   Wt (!) 149.7 kg   SpO2 98%   BMI 56.64 kg/m   Visual Acuity Right Eye Distance:   Left Eye Distance:   Bilateral Distance:    Right Eye Near:   Left Eye Near:    Bilateral Near:  Physical Exam Vitals reviewed.  Constitutional:      General: She is not in acute distress.    Appearance: She is not toxic-appearing.  Musculoskeletal:     Comments: There is some tenderness of the medial left knee.  Possibly some effusion  Skin:    Coloration: Skin is not jaundiced or pale.  Neurological:     General: No focal deficit present.     Mental Status: She is alert and oriented to person, place, and time.  Psychiatric:        Behavior: Behavior normal.      UC Treatments / Results  Labs (all labs ordered are listed, but only abnormal results are displayed) Labs Reviewed - No data to display  EKG   Radiology No results found.  Procedures Procedures (including critical care time)  Medications Ordered in UC Medications - No data to display  Initial Impression / Assessment and Plan / UC Course  I have reviewed the triage vital signs and the nursing notes.  Pertinent labs & imaging results that were available during my care of the patient were reviewed by me and considered in my medical decision making (see chart for details).      By my review there are no broken bones on the x-rays.  Naproxen is sent in to take for the next few days.  I have asked her to please restart her blood pressure medicine and follow-up with primary care Final Clinical Impressions(s) / UC Diagnoses   Final diagnoses:  Acute pain of left knee     Discharge Instructions      By my review your x-ray does not show any broken bones. The radiologist will also read your x-ray, and if their interpretation differs significantly from mine, we will call you.  Take naproxen 500 mg--1 tablet every 12 hours as needed for pain  Please restart taking your blood pressure  medicine and follow-up with your primary care.     ED Prescriptions     Medication Sig Dispense Auth. Provider   naproxen (NAPROSYN) 500 MG tablet Take 1 tablet (500 mg total) by mouth 2 (two) times daily as needed (pain). 30 tablet Cam Dauphin, Janace Aris, MD      PDMP not reviewed this encounter.   Zenia Resides, MD 07/10/23 [id number]

## 2023-09-26 ENCOUNTER — Encounter (HOSPITAL_COMMUNITY): Payer: Self-pay

## 2023-09-26 ENCOUNTER — Ambulatory Visit (HOSPITAL_COMMUNITY)
Admission: EM | Admit: 2023-09-26 | Discharge: 2023-09-26 | Disposition: A | Discharge status: Home / Self Care | Payer: Medicaid Other

## 2023-09-26 DIAGNOSIS — B349 Viral infection, unspecified: Secondary | ICD-10-CM

## 2023-09-26 LAB — POC COVID19/FLU A&B COMBO
Covid Antigen, POC: NEGATIVE
Influenza A Antigen, POC: NEGATIVE
Influenza B Antigen, POC: NEGATIVE

## 2023-09-26 MED ORDER — ONDANSETRON 4 MG PO TBDP
ORAL_TABLET | ORAL | Status: AC
  Filled 2023-09-26: qty 1

## 2023-09-26 MED ORDER — ONDANSETRON 4 MG PO TBDP: 4.0000 mg | ORAL_TABLET | Freq: Three times a day (TID) | ORAL | 0 refills | Status: AC | PRN

## 2023-09-26 MED ORDER — ONDANSETRON 4 MG PO TBDP
4.0000 mg | ORAL_TABLET | Freq: Once | ORAL | Status: AC
Start: 2023-09-26 — End: 2023-09-26
  Administered 2023-09-26: 4 mg via ORAL

## 2023-09-26 NOTE — ED Triage Notes (Signed)
Diarrhea and headache x 2 days. Patient states this past Sunday she had chills but they went away. Headache ongoing since her birthday 2 days ago. Saw some blood in the toilet as well.   No one else with similar symptoms or sick. No dietary or medication changes. Her friend cooked for her birthday but no one else was sick.   Patient tried otc diarrhea meds with no relief.

## 2023-09-26 NOTE — Discharge Instructions (Addendum)
Your covid and flu are negative. Avoid smoking and alcohol as this will make symptoms worse. Push fluids(gatorade,jello,broth,etc), if you have worsening abdominal pain, unable to keep fluids down, go to ER for further evaluation. Avoid spicy,greasy,fried foods.

## 2023-09-26 NOTE — ED Provider Notes (Signed)
[facility]    CSN: [id number] Arrival date & time: 09/26/23  1453      History   Chief Complaint Chief Complaint  Patient presents with   Headache   Diarrhea    HPI Samantha Morse is a 23 y.o. female.   23 year old female, Samantha Morse, presents to urgent care for evaluation of diarrhea and headache x 2 days. Pt reports she has noticed some blood in toilet after BM, pt celebrated her birthday over the weekend and did drink alcohol, friend cooked for her, no other persons sick per pt report. Also endorses smoking marijuana.   LMP 07/30/23(has girlfriend)  The history is provided by the patient. No language interpreter was used.  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   right   Irregular periods    Hx - now on OCP daily   Migraines    Muscle spasm    Obesity    Pollen allergy    PONV (postoperative nausea and vomiting)     Patient Active Problem List   Diagnosis Date Noted   Nonspecific syndrome suggestive of viral illness 09/26/2023   Acute appendicitis 06/15/2021   Migraine without aura and without status migrainosus, not intractable 01/07/2021   Cervical myofascial pain syndrome 01/07/2021   Neck pain 09/30/2020   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   Peritonsillar abscess 02/18/2019   Snoring 05/29/2018   Obesity 06/28/2016   Hidradenitis suppurativa 06/28/2016   Amenorrhea 06/28/2016    Past Surgical History:  Procedure Laterality Date   APPENDECTOMY     HYDRADENITIS EXCISION Left 04/27/2015   Procedure: EXCISION HIDRADENITIS LEFT AXILLA RYAN  POLLACK CLOSURE ;  Surgeon: Louisa Second, MD;  Location: [facility];  Service: Plastics;  Laterality: Left;   HYDRADENITIS EXCISION Right 08/10/2015   Procedure: EXCISION HIDRADENITIS RIGHT AXILLA, RYAN POLLOCK CLOSURE;  Surgeon: Louisa Second, MD;  Location: [facility];  Service: Plastics;  Laterality: Right;   LAPAROSCOPIC APPENDECTOMY N/A 07/05/2021   Procedure: APPENDECTOMY LAPAROSCOPIC;  Surgeon: Quentin Ore, MD;  Location: [facility];  Service: General;  Laterality: N/A;   TONSILLECTOMY Bilateral 03/15/2019   Procedure: TONSILLECTOMY;  Surgeon: Graylin Shiver, MD;  Location: [facility];  Service: ENT;  Laterality: Bilateral;   TONSILLECTOMY      OB History   No obstetric history on file.      Home Medications    Prior to Admission medications   Medication Sig Start Date End Date Taking? Authorizing Provider  albuterol (VENTOLIN HFA) 108 (90 Base) MCG/ACT inhaler Inhale 1-2 puffs into the lungs every 4 (four) hours as needed for wheezing or shortness of breath. 05/24/22  Yes Carlisle Beers, FNP  amLODipine (NORVASC) 2.5 MG tablet Take 2.5 mg by mouth daily.   Yes [provider]  gabapentin (NEURONTIN) 300 MG capsule Take 300 mg by mouth 3 (three) times daily. 03/27/22  Yes [provider]  methocarbamol (ROBAXIN) 500 MG tablet Take 500 mg by mouth 3 (three) times daily. 03/28/22  Yes [provider]  ondansetron (ZOFRAN-ODT) 4 MG disintegrating  tablet Take 1 tablet (4 mg total) by mouth every 8 (eight) hours as needed for up to 2 days for nausea or vomiting. 09/26/23 09/28/23 Yes Sinia Antosh, Para March, NP  rizatriptan (MAXALT-MLT) 10 MG disintegrating tablet Take by mouth. 11/29/21  Yes [provider]  Vitamin D, Ergocalciferol, (DRISDOL) 1.25 MG (50000 UNIT) CAPS capsule Take 50,000 Units by mouth once a week. 03/04/22  Yes [provider]    Family History Family History  Problem Relation Age of  Onset   Thyroid disease Mother    Diabetes Mother    Hypertension Mother    Heart disease Mother        MI   Pulmonary embolism Mother    Hernia Mother    Migraines Mother    Hypertension Maternal Grandmother    Kidney cancer Maternal Grandmother    Diabetes Maternal Grandfather    Hypertension Maternal Grandfather    Cirrhosis Maternal Aunt    Diabetes Maternal Uncle     Social History Social History   Tobacco Use   Smoking status: Never   Smokeless tobacco: Never  Vaping Use   Vaping status: Never Used  Substance Use Topics   Alcohol use: Yes    Comment: socially   Drug use: Yes    Types: Marijuana    Comment: daily     Allergies   Bee venom, Bee pollen, and Pollen extract   Review of Systems Review of Systems  Constitutional:  Positive for chills. Negative for fever.  Gastrointestinal:  Positive for diarrhea.  Neurological:  Positive for headaches.  All other systems reviewed and are negative.    Physical Exam Triage Vital Signs ED Triage Vitals  Encounter Vitals Group     BP 09/26/23 1558 (!) 159/97     Systolic BP Percentile --      Diastolic BP Percentile --      Pulse Rate 09/26/23 1558 83     Resp 09/26/23 1558 18     Temp 09/26/23 1558 99 F (37.2 C)     Temp Source 09/26/23 1558 Oral     SpO2 09/26/23 1558 96 %     Weight 09/26/23 1558 (!) 330 lb (149.7 kg)     Height 09/26/23 1558 5\' 5"  (1.651 m)     Head Circumference --      Peak Flow --      Pain Score 09/26/23 1554 5     Pain Loc --      Pain Education --      Exclude from Growth Chart --    No data found.  Updated Vital Signs BP (!) 159/97 (BP Location: Right Wrist)   Pulse 83   Temp 99 F (37.2 C) (Oral)   Resp 18   Ht 5\' 5"  (1.651 m)   Wt (!) 330 lb (149.7 kg)   LMP 07/30/2023 (Approximate)   SpO2 96%   BMI 54.91 kg/m   Visual Acuity Right Eye Distance:   Left Eye Distance:   Bilateral Distance:    Right Eye Near:   Left Eye Near:    Bilateral Near:      Physical Exam   UC Treatments / Results  Labs (all labs ordered are listed, but only abnormal results are displayed) Labs Reviewed  POC COVID19/FLU A&B COMBO    EKG   Radiology No results found.  Procedures Procedures (including critical care time)  Medications Ordered in UC Medications  ondansetron (ZOFRAN-ODT) disintegrating tablet 4 mg (4 mg Oral Given 09/26/23  1625)    Initial Impression / Assessment and Plan / UC Course  I have reviewed the triage vital signs and the nursing notes.  Pertinent labs & imaging results that were available during my care of the patient were reviewed by me and considered in my medical decision making (see chart for details).    Discussed exam findings and plan of care with patient, strict go to ER precautions given.   Patient verbalized understanding to this provider.  Ddx: Viral illness, gastroenteritis, GI bleed, dehydration, allergies Final Clinical Impressions(s) / UC Diagnoses   Final diagnoses:  Nonspecific syndrome suggestive of viral illness     Discharge Instructions      Your covid and flu are negative. Avoid smoking nad alcohol as this will make symptoms worse. Push fluids(gatorade,jello,broth,etc), if you have worsening abdominal pain, unable to keep fluids down, go to ER for further evaluation. Avoid spicy,greasy,fried foods.      ED Prescriptions     Medication Sig Dispense Auth. Provider   ondansetron (ZOFRAN-ODT) 4 MG disintegrating tablet Take 1 tablet (4 mg total) by mouth every 8 (eight) hours as needed for up to 2 days for nausea or vomiting. 6 tablet Loriann Bosserman, Para March, NP      PDMP not reviewed this encounter.   Clancy Gourd, NP 09/26/23 1800

## 2023-10-19 ENCOUNTER — Ambulatory Visit (HOSPITAL_COMMUNITY)
Admission: RE | Admit: 2023-10-19 | Discharge: 2023-10-19 | Disposition: A | Discharge status: Home / Self Care | Payer: Medicaid Other | Source: Ambulatory Visit

## 2023-10-19 ENCOUNTER — Emergency Department (HOSPITAL_COMMUNITY)
Admission: EM | Admit: 2023-10-19 | Discharge: 2023-10-19 | Disposition: A | Discharge status: Home / Self Care | Payer: Medicaid Other | Attending: Emergency Medicine | Admitting: Emergency Medicine

## 2023-10-19 ENCOUNTER — Encounter (HOSPITAL_COMMUNITY): Payer: Self-pay

## 2023-10-19 ENCOUNTER — Other Ambulatory Visit: Payer: Self-pay

## 2023-10-19 ENCOUNTER — Encounter (HOSPITAL_COMMUNITY): Payer: Self-pay | Admitting: Emergency Medicine

## 2023-10-19 VITALS — BP 173/98 | HR 95 | Temp 98.2°F | Resp 18

## 2023-10-19 DIAGNOSIS — R109 Unspecified abdominal pain: Secondary | ICD-10-CM

## 2023-10-19 DIAGNOSIS — R1032 Left lower quadrant pain: Secondary | ICD-10-CM

## 2023-10-19 LAB — URINALYSIS, ROUTINE W REFLEX MICROSCOPIC
Bilirubin Urine: NEGATIVE
Glucose, UA: NEGATIVE mg/dL
Hgb urine dipstick: NEGATIVE
Ketones, ur: NEGATIVE mg/dL
Leukocytes,Ua: NEGATIVE
Nitrite: NEGATIVE
Protein, ur: NEGATIVE mg/dL
Specific Gravity, Urine: 1.016 (ref 1.005–1.030)
pH: 7 (ref 5.0–8.0)

## 2023-10-19 LAB — CBC WITH DIFFERENTIAL/PLATELET
Abs Immature Granulocytes: 0.01 10*3/uL (ref 0.00–0.07)
Basophils Absolute: 0 10*3/uL (ref 0.0–0.1)
Basophils Relative: 0 %
Eosinophils Absolute: 0.2 10*3/uL (ref 0.0–0.5)
Eosinophils Relative: 2 %
HCT: 39.6 % (ref 36.0–46.0)
Hemoglobin: 13.3 g/dL (ref 12.0–15.0)
Immature Granulocytes: 0 %
Lymphocytes Relative: 38 %
Lymphs Abs: 3.5 10*3/uL (ref 0.7–4.0)
MCH: 29.5 pg (ref 26.0–34.0)
MCHC: 33.6 g/dL (ref 30.0–36.0)
MCV: 87.8 fL (ref 80.0–100.0)
Monocytes Absolute: 0.4 10*3/uL (ref 0.1–1.0)
Monocytes Relative: 4 %
Neutro Abs: 5 10*3/uL (ref 1.7–7.7)
Neutrophils Relative %: 56 %
Platelets: 279 10*3/uL (ref 150–400)
RBC: 4.51 MIL/uL (ref 3.87–5.11)
RDW: 12.1 % (ref 11.5–15.5)
WBC: 9.2 10*3/uL (ref 4.0–10.5)
nRBC: 0 % (ref 0.0–0.2)

## 2023-10-19 LAB — COMPREHENSIVE METABOLIC PANEL
ALT: 36 U/L (ref 0–44)
AST: 29 U/L (ref 15–41)
Albumin: 3.6 g/dL (ref 3.5–5.0)
Alkaline Phosphatase: 58 U/L (ref 38–126)
Anion gap: 15 (ref 5–15)
BUN: 6 mg/dL (ref 6–20)
CO2: 21 mmol/L — ABNORMAL LOW (ref 22–32)
Calcium: 9.2 mg/dL (ref 8.9–10.3)
Chloride: 100 mmol/L (ref 98–111)
Creatinine, Ser: 0.61 mg/dL (ref 0.44–1.00)
GFR, Estimated: >60 mL/min (ref >60)
Glucose, Bld: 102 mg/dL — ABNORMAL HIGH (ref 70–99)
Potassium: 3.8 mmol/L (ref 3.5–5.1)
Sodium: 136 mmol/L (ref 135–145)
Total Bilirubin: 0.9 mg/dL (ref 0.0–1.2)
Total Protein: 7.4 g/dL (ref 6.5–8.1)

## 2023-10-19 LAB — RESP PANEL BY RT-PCR (RSV, FLU A&B, COVID)  RVPGX2
Influenza A by PCR: NEGATIVE
Influenza B by PCR: NEGATIVE
Resp Syncytial Virus by PCR: NEGATIVE
SARS Coronavirus 2 by RT PCR: NEGATIVE

## 2023-10-19 LAB — LIPASE, BLOOD: Lipase: 106 U/L — ABNORMAL HIGH (ref 11–51)

## 2023-10-19 LAB — HCG, SERUM, QUALITATIVE: Preg, Serum: NEGATIVE

## 2023-10-19 MED ORDER — AMOXICILLIN-POT CLAVULANATE 875-125 MG PO TABS: 1.0000 | ORAL_TABLET | Freq: Two times a day (BID) | ORAL | 0 refills | Status: AC

## 2023-10-19 MED ORDER — DICYCLOMINE HCL 20 MG PO TABS: 20.0000 mg | ORAL_TABLET | Freq: Two times a day (BID) | ORAL | 0 refills | Status: AC

## 2023-10-19 NOTE — ED Triage Notes (Signed)
  Patient comes in with L sided abdominal/flank pain that has been going on since 2/12.  Patient was diagnosed with stomach virus then and has been taking kaopectate at home.  Patient endorses intermittent diarrhea with some blood when wiping.  Denies any dysuria.  Denies any N/V.  Pain 5/10, sharp.

## 2023-10-19 NOTE — ED Triage Notes (Signed)
 Pt was seen here previously and was told she had a stomach virus. Pt has been having LLQ pain that radiates to her flank bilaterally. This started on 2/11 or 2/12.   The weekend of 2/14 she was having diarrhea, blood in her bowel movements, and black specks in her bowel movements. This cleared up on 2/16.

## 2023-10-19 NOTE — ED Notes (Signed)
 Patient is being discharged from the Urgent Care and sent to the Emergency Department via POV . Per Wynonia Lawman, NP, patient is in need of higher level of care due to abdominal pain. Patient is aware and verbalizes understanding of plan of care.  Vitals:   10/19/23 1822  BP: (!) 173/98  Pulse: 95  Resp: 18  Temp: 98.2 F (36.8 C)  SpO2: 98%

## 2023-10-19 NOTE — ED Provider Notes (Signed)
 [facility]    CSN: [id number] Arrival date & time: 10/19/23  1754      History   Chief Complaint Chief Complaint  Patient presents with   Abdominal Pain    Stomach and back pains with blood in Diarrhea - Entered by patient   Flank Pain    HPI Samantha Morse is a 23 y.o. female.   Patient presents with worsening left lower quadrant pain that radiates to her back after being seen here on 1/28 and diagnosed with a viral illness.  Patient states that on 2/14 and 2/15 she was having diarrhea with blood and black specks in her stool.  Denies nausea, vomiting, and fever.   Abdominal Pain Flank Pain Associated symptoms include abdominal pain.    Past Medical History:  Diagnosis Date   Asthma    prn inhaler   Asthma    Complication of anesthesia 04/27/2015   vomiting and laryngospasm on extubation, requiring CPAP, jaw thrust and succinylcholine   GERD (gastroesophageal reflux disease)    Hidradenitis axillaris 07/2015   right   Irregular periods    Hx - now on OCP daily   Migraines    Muscle spasm    Obesity    Pollen allergy    PONV (postoperative nausea and vomiting)     Patient Active Problem List   Diagnosis Date Noted   Nonspecific syndrome suggestive of viral illness 09/26/2023   Acute appendicitis 06/15/2021   Migraine without aura and without status migrainosus, not intractable 01/07/2021   Cervical myofascial pain syndrome 01/07/2021   Neck pain 09/30/2020   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   Peritonsillar abscess 02/18/2019   Snoring 05/29/2018   Obesity 06/28/2016   Hidradenitis suppurativa 06/28/2016   Amenorrhea 06/28/2016    Past Surgical History:  Procedure Laterality Date   APPENDECTOMY     HYDRADENITIS EXCISION Left 04/27/2015   Procedure: EXCISION HIDRADENITIS LEFT AXILLA [name] CLOSURE ;  Surgeon: Louisa Second, MD;  Location: [facility];  Service: Plastics;  Laterality: Left;   HYDRADENITIS EXCISION  Right 08/10/2015   Procedure: EXCISION HIDRADENITIS RIGHT AXILLA, RYAN POLLOCK CLOSURE;  Surgeon: Louisa Second, MD;  Location: [facility];  Service: Plastics;  Laterality: Right;   LAPAROSCOPIC APPENDECTOMY N/A 07/05/2021   Procedure: APPENDECTOMY LAPAROSCOPIC;  Surgeon: Quentin Ore, MD;  Location: [facility];  Service: General;  Laterality: N/A;   TONSILLECTOMY Bilateral 03/15/2019   Procedure: TONSILLECTOMY;  Surgeon: Graylin Shiver, MD;  Location: [facility];  Service: ENT;  Laterality: Bilateral;   TONSILLECTOMY      OB History   No obstetric history on file.      Home Medications    Prior to Admission medications   Medication Sig Start Date End Date Taking? Authorizing Provider  albuterol (VENTOLIN HFA) 108 (90 Base) MCG/ACT inhaler Inhale 1-2 puffs into the lungs every 4 (four) hours as needed for wheezing or shortness of breath. 05/24/22  Yes Carlisle Beers, FNP  amLODipine (NORVASC) 2.5 MG tablet Take 2.5 mg by mouth daily.   Yes [provider]  gabapentin (NEURONTIN) 300 MG capsule Take 300 mg by mouth 3 (three) times daily. 03/27/22  Yes [provider]  hydrOXYzine (ATARAX) 10 MG tablet Take by mouth. 02/09/23  Yes [provider]  methocarbamol (ROBAXIN) 500 MG tablet Take 500 mg by mouth 3 (three) times daily. 03/28/22  Yes [provider]  rizatriptan (MAXALT-MLT) 10 MG disintegrating tablet Take by mouth. 11/29/21  [provider]  Vitamin D, Ergocalciferol, (DRISDOL) 1.25 MG (50000 UNIT) CAPS capsule Take 50,000 Units by mouth once a week. 03/04/22   [provider]    Family History Family History  Problem Relation Age of Onset   Thyroid disease Mother    Diabetes Mother    Hypertension Mother    Heart disease Mother        MI   Pulmonary embolism Mother    Hernia Mother    Migraines Mother    Hypertension Maternal Grandmother    Kidney cancer Maternal Grandmother    Diabetes  Maternal Grandfather    Hypertension Maternal Grandfather    Cirrhosis Maternal Aunt    Diabetes Maternal Uncle     Social History Social History   Tobacco Use   Smoking status: Never   Smokeless tobacco: Never  Vaping Use   Vaping status: Never Used  Substance Use Topics   Alcohol use: Yes    Comment: socially   Drug use: Yes    Types: Marijuana    Comment: daily     Allergies   Bee venom, Bee pollen, and Pollen extract   Review of Systems Review of Systems  Gastrointestinal:  Positive for abdominal pain.  Genitourinary:  Positive for flank pain.   Per HPI  Physical Exam Triage Vital Signs ED Triage Vitals  Encounter Vitals Group     BP 10/19/23 1822 (!) 173/98     Systolic BP Percentile --      Diastolic BP Percentile --      Pulse Rate 10/19/23 1822 95     Resp 10/19/23 1822 18     Temp 10/19/23 1822 98.2 F (36.8 C)     Temp Source 10/19/23 1822 Oral     SpO2 10/19/23 1822 98 %     Weight --      Height --      Head Circumference --      Peak Flow --      Pain Score 10/19/23 1820 6     Pain Loc --      Pain Education --      Exclude from Growth Chart --    No data found.  Updated Vital Signs BP (!) 173/98 (BP Location: Left Arm)   Pulse 95   Temp 98.2 F (36.8 C) (Oral)   Resp 18   LMP 09/18/2023 (Approximate)   SpO2 98%   Visual Acuity Right Eye Distance:   Left Eye Distance:   Bilateral Distance:    Right Eye Near:   Left Eye Near:    Bilateral Near:     Physical Exam Vitals and nursing note reviewed.  Constitutional:      General: She is not in acute distress.    Appearance: She is well-developed. She is not ill-appearing.  Cardiovascular:     Rate and Rhythm: Normal rate and regular rhythm.  Pulmonary:     Effort: Pulmonary effort is normal.     Breath sounds: Normal breath sounds.  Abdominal:     General: Abdomen is protuberant. Bowel sounds are normal.     Palpations: Abdomen is soft.     Tenderness: There is abdominal  tenderness in the epigastric area and left lower quadrant.  Neurological:     Mental Status: She is alert.      UC Treatments / Results  Labs (all labs ordered are listed, but only abnormal results are displayed) Labs Reviewed - No data to display  EKG  Radiology No results found.  Procedures Procedures (including critical care time)  Medications Ordered in UC Medications - No data to display  Initial Impression / Assessment and Plan / UC Course  I have reviewed the triage vital signs and the nursing notes.  Pertinent labs & imaging results that were available during my care of the patient were reviewed by me and considered in my medical decision making (see chart for details).     Patient presented with worsening left lower quadrant pain that radiates to her back after being seen here on 1/28 and diagnosed with a viral illness.  Patient states that on 2/14 and 2/15 she was having diarrhea with blood and black specks.  Denies any other symptoms.  Upon assessment abdomen is protuberant and soft patient is tender to epigastric region and left lower quadrant with guarding.  Bowel sounds normal.  Recommended that patient be seen in ER for further evaluation of abdominal pain with imaging due to worsening symptoms and significantly tender upon palpation.  Patient agreeable to plan at this time.  Patient is stable at this time to arrived to ER via POV. Final Clinical Impressions(s) / UC Diagnoses   Final diagnoses:  Left lower quadrant abdominal pain     Discharge Instructions      Go to ER for further evaluation.      ED Prescriptions   None    PDMP not reviewed this encounter.   Letta Kocher, NP 10/19/23 [phone]

## 2023-10-19 NOTE — Discharge Instructions (Signed)
 Follow-up with your PCP and gastroenterologist in the office.  Please return for worsening or persistent pain fever inability eat or drink.

## 2023-10-19 NOTE — ED Provider Notes (Signed)
 [place] [facility] AT [facility] Provider Note   CSN: [id number] Arrival date & time: 10/19/23  1906     History  Chief Complaint  Patient presents with   Abdominal Pain   Flank Pain    Samantha Morse is a 23 y.o. female.  23 yo F with a chief complaint of left-sided abdominal pain.  This been going on for a couple weeks.  Actually started with what sounds like a diarrheal illness that went on for about a week and then resolved.  Since then she has had some pain off and on.  Actually felt well a couple days ago.  Started having worsening pain.  A few days ago she had had some blood in her stool as well.  This also seems to resolved.  No fevers no vomiting.   Abdominal Pain Flank Pain Associated symptoms include abdominal pain.       Home Medications Prior to Admission medications   Medication Sig Start Date End Date Taking? Authorizing Provider  amoxicillin-clavulanate (AUGMENTIN) 875-125 MG tablet Take 1 tablet by mouth every 12 (twelve) hours. 10/19/23  Yes Melene Plan, DO  dicyclomine (BENTYL) 20 MG tablet Take 1 tablet (20 mg total) by mouth 2 (two) times daily. 10/19/23  Yes Melene Plan, DO  albuterol (VENTOLIN HFA) 108 (90 Base) MCG/ACT inhaler Inhale 1-2 puffs into the lungs every 4 (four) hours as needed for wheezing or shortness of breath. 05/24/22   Carlisle Beers, FNP  amLODipine (NORVASC) 2.5 MG tablet Take 2.5 mg by mouth daily.    [provider]  gabapentin (NEURONTIN) 300 MG capsule Take 300 mg by mouth 3 (three) times daily. 03/27/22   [provider]  hydrOXYzine (ATARAX) 10 MG tablet Take by mouth. 02/09/23   [provider]  methocarbamol (ROBAXIN) 500 MG tablet Take 500 mg by mouth 3 (three) times daily. 03/28/22   [provider]  rizatriptan (MAXALT-MLT) 10 MG disintegrating tablet Take by mouth. 11/29/21   [provider]  Vitamin D, Ergocalciferol, (DRISDOL) 1.25 MG (50000 UNIT) CAPS  capsule Take 50,000 Units by mouth once a week. 03/04/22   [provider]      Allergies    Bee venom, Bee pollen, and Pollen extract    Review of Systems   Review of Systems  Gastrointestinal:  Positive for abdominal pain.  Genitourinary:  Positive for flank pain.    Physical Exam Updated Vital Signs BP (!) 162/110 (BP Location: Right Arm)   Pulse (!) 107   Temp 97.9 F (36.6 C)   Resp 18   Ht 5\' 5"  (1.651 m)   Wt (!) 149.7 kg   LMP 09/18/2023 (Approximate)   SpO2 97%   BMI 54.91 kg/m  Physical Exam Vitals and nursing note reviewed.  Constitutional:      General: She is not in acute distress.    Appearance: She is well-developed. She is not diaphoretic.     Comments: BMI 55  HENT:     Head: Normocephalic and atraumatic.  Eyes:     Pupils: Pupils are equal, round, and reactive to light.  Cardiovascular:     Rate and Rhythm: Normal rate and regular rhythm.     Heart sounds: No murmur heard.    No friction rub. No gallop.  Pulmonary:     Effort: Pulmonary effort is normal.     Breath sounds: No wheezing or rales.  Abdominal:     General: There is no distension.  Palpations: Abdomen is soft.     Tenderness: There is no abdominal tenderness.     Comments: I do not appreciate any obvious discomfort on abdominal exam.  Deep palpation of left lower quadrant does not appreciate any guarding or rebound or discomfort.  Musculoskeletal:        General: No tenderness.     Cervical back: Normal range of motion and neck supple.  Skin:    General: Skin is warm and dry.  Neurological:     Mental Status: She is alert and oriented to person, place, and time.  Psychiatric:        Behavior: Behavior normal.     ED Results / Procedures / Treatments   Labs (all labs ordered are listed, but only abnormal results are displayed) Labs Reviewed  COMPREHENSIVE METABOLIC PANEL - Abnormal; Notable for the following components:      Result Value   CO2 21 (*)    Glucose,  Bld 102 (*)    All other components within normal limits  LIPASE, BLOOD - Abnormal; Notable for the following components:   Lipase 106 (*)    All other components within normal limits  RESP PANEL BY RT-PCR (RSV, FLU A&B, COVID)  RVPGX2  CBC WITH DIFFERENTIAL/PLATELET  URINALYSIS, ROUTINE W REFLEX MICROSCOPIC  HCG, SERUM, QUALITATIVE  POC OCCULT BLOOD, ED    EKG None  Radiology No results found.  Procedures Procedures    Medications Ordered in ED Medications - No data to display  ED Course/ Medical Decision Making/ A&P                                 Medical Decision Making Amount and/or Complexity of Data Reviewed Labs: ordered.  Risk Prescription drug management.   23 yo F with a chief complaints of left flank pain.  This been going on for a couple weeks off and on.  Seems unlikely to be diverticulitis with its coming and going.  She has a very benign abdominal exam for me.  It sounds like she had some guarding done at urgent care.  She is afebrile here, no leukocytosis.  No significant electrolyte abnormalities.  UA is negative for infection on my independent interpretation.  Her lipase is elevated.  I am not sure the significance of this.  She has no epigastric pain.  Has no pain on exam.  Though it seems less likely I will start her on a course of antibiotics for possible diverticulitis.  I do not think she benefit from CT imaging at this time.  The other possibility I think would be constipation or perhaps she has irritable bowel syndrome.  Less likely I think inflammatory bowel syndrome but with her history of blood in the stool will have her follow-up with GI.  Patient tells me that when she had her appendix removed there was some concern and what sounds like the pathology report and they did recommend a colonoscopy at that time.   9:29 PM:  I have discussed the diagnosis/risks/treatment options with the patient.  Evaluation and diagnostic testing in the emergency  department does not suggest an emergent condition requiring admission or immediate intervention beyond what has been performed at this time.  They will follow up with PCP, GI. We also discussed returning to the ED immediately if new or worsening sx occur. We discussed the sx which are most concerning (e.g., sudden worsening pain, fever, inability to tolerate  by mouth) that necessitate immediate return. Medications administered to the patient during their visit and any new prescriptions provided to the patient are listed below.  Medications given during this visit Medications - No data to display   The patient appears reasonably screen and/or stabilized for discharge and I doubt any other medical condition or other [facility] requiring further screening, evaluation, or treatment in the ED at this time prior to discharge.          Final Clinical Impression(s) / ED Diagnoses Final diagnoses:  Left flank pain    Rx / DC Orders ED Discharge Orders          Ordered    dicyclomine (BENTYL) 20 MG tablet  2 times daily        10/19/23 2123    amoxicillin-clavulanate (AUGMENTIN) 875-125 MG tablet  Every 12 hours        10/19/23 2124              Melene Plan, DO 10/19/23 2129

## 2023-10-19 NOTE — Discharge Instructions (Signed)
 Go to ER for further evaluation .

## 2023-10-19 NOTE — ED Provider Triage Note (Signed)
 Emergency Medicine Provider Triage Evaluation Note  Samantha Morse , a 23 y.o. female  was evaluated in triage.  Pt complains of bright red blood per rectum.  Patient is getting over a GI bug and states that over the past few days she has had bright red blood per rectum with tiny black specks in them.  Patient denies nausea vomiting fevers.  Patient does endorse some left lower quadrant abdominal pain when she is having loose stool.  Patient is unsure of hemorrhoids but denies history of diverticulosis.  Review of Systems  Positive:  Negative:   Physical Exam  BP (!) 162/110 (BP Location: Right Arm)   Pulse (!) 107   Temp 97.9 F (36.6 C)   Resp 18   Ht 5\' 5"  (1.651 m)   Wt (!) 149.7 kg   LMP 09/18/2023 (Approximate)   SpO2 97%   BMI 54.91 kg/m  Gen:   Awake, no distress   Resp:  Normal effort  MSK:   Moves extremities without difficulty  Other:  No abdominal tenderness  Medical Decision Making  Medically screening exam initiated at 7:28 PM.  Appropriate orders placed.  Samantha Morse was informed that the remainder of the evaluation will be completed by another provider, this initial triage assessment does not replace that evaluation, and the importance of remaining in the ED until their evaluation is complete.  Workup initiated, patient is getting over a GI bug and has had multiple episodes of diarrhea with bright red blood suspicious of possible hemorrhoid, patient did not have any abdominal pain on my exam and so we will withhold imaging, patient stable at this time.   Netta Corrigan, [place] 10/19/23 1929

## 2023-10-23 ENCOUNTER — Emergency Department (HOSPITAL_COMMUNITY): Payer: Medicaid Other

## 2023-10-23 ENCOUNTER — Other Ambulatory Visit: Payer: Self-pay

## 2023-10-23 ENCOUNTER — Encounter (HOSPITAL_COMMUNITY): Payer: Self-pay

## 2023-10-23 ENCOUNTER — Emergency Department (HOSPITAL_COMMUNITY)
Admission: EM | Admit: 2023-10-23 | Discharge: 2023-10-23 | Disposition: A | Discharge status: Home / Self Care | Payer: Medicaid Other | Attending: Emergency Medicine | Admitting: Emergency Medicine

## 2023-10-23 DIAGNOSIS — R1084 Generalized abdominal pain: Secondary | ICD-10-CM | POA: Insufficient documentation

## 2023-10-23 DIAGNOSIS — J45909 Unspecified asthma, uncomplicated: Secondary | ICD-10-CM | POA: Insufficient documentation

## 2023-10-23 DIAGNOSIS — R1032 Left lower quadrant pain: Secondary | ICD-10-CM | POA: Diagnosis not present

## 2023-10-23 DIAGNOSIS — R109 Unspecified abdominal pain: Secondary | ICD-10-CM | POA: Diagnosis not present

## 2023-10-23 DIAGNOSIS — K429 Umbilical hernia without obstruction or gangrene: Secondary | ICD-10-CM | POA: Diagnosis not present

## 2023-10-23 LAB — CBC
HCT: 44.2 % (ref 36.0–46.0)
Hemoglobin: 14.6 g/dL (ref 12.0–15.0)
MCH: 29.7 pg (ref 26.0–34.0)
MCHC: 33 g/dL (ref 30.0–36.0)
MCV: 89.8 fL (ref 80.0–100.0)
Platelets: 416 10*3/uL — ABNORMAL HIGH (ref 150–400)
RBC: 4.92 MIL/uL (ref 3.87–5.11)
RDW: 12 % (ref 11.5–15.5)
WBC: 7.4 10*3/uL (ref 4.0–10.5)
nRBC: 0 % (ref 0.0–0.2)

## 2023-10-23 LAB — COMPREHENSIVE METABOLIC PANEL
ALT: 38 U/L (ref 0–44)
AST: 27 U/L (ref 15–41)
Albumin: 4.2 g/dL (ref 3.5–5.0)
Alkaline Phosphatase: 68 U/L (ref 38–126)
Anion gap: 11 (ref 5–15)
BUN: 8 mg/dL (ref 6–20)
CO2: 22 mmol/L (ref 22–32)
Calcium: 9.4 mg/dL (ref 8.9–10.3)
Chloride: 104 mmol/L (ref 98–111)
Creatinine, Ser: 0.47 mg/dL (ref 0.44–1.00)
GFR, Estimated: >60 mL/min (ref >60)
Glucose, Bld: 83 mg/dL (ref 70–99)
Potassium: 4.2 mmol/L (ref 3.5–5.1)
Sodium: 137 mmol/L (ref 135–145)
Total Bilirubin: 0.7 mg/dL (ref 0.0–1.2)
Total Protein: 8.4 g/dL — ABNORMAL HIGH (ref 6.5–8.1)

## 2023-10-23 LAB — URINALYSIS, ROUTINE W REFLEX MICROSCOPIC
Bacteria, UA: NONE SEEN
Bilirubin Urine: NEGATIVE
Glucose, UA: NEGATIVE mg/dL
Ketones, ur: NEGATIVE mg/dL
Leukocytes,Ua: NEGATIVE
Nitrite: NEGATIVE
Protein, ur: NEGATIVE mg/dL
RBC / HPF: >50 RBC/hpf (ref 0–5)
Specific Gravity, Urine: 1.027 (ref 1.005–1.030)
pH: 6 (ref 5.0–8.0)

## 2023-10-23 LAB — HCG, SERUM, QUALITATIVE: Preg, Serum: NEGATIVE

## 2023-10-23 LAB — LIPASE, BLOOD: Lipase: 84 U/L — ABNORMAL HIGH (ref 11–51)

## 2023-10-23 MED ORDER — IOHEXOL 300 MG/ML  SOLN
100.0000 mL | Freq: Once | INTRAMUSCULAR | Status: AC | PRN
  Administered 2023-10-23: 100 mL via INTRAVENOUS

## 2023-10-23 MED ORDER — DICYCLOMINE HCL 10 MG PO CAPS
10.0000 mg | ORAL_CAPSULE | Freq: Once | ORAL | Status: AC
  Administered 2023-10-23: 10 mg via ORAL
  Filled 2023-10-23: qty 1

## 2023-10-23 MED ORDER — MORPHINE SULFATE (PF) 4 MG/ML IV SOLN
4.0000 mg | Freq: Once | INTRAVENOUS | Status: AC
  Administered 2023-10-23: 4 mg via INTRAVENOUS
  Filled 2023-10-23 (×2): qty 1

## 2023-10-23 NOTE — ED Triage Notes (Signed)
 Patient has abdominal pain for 2 weeks. Was seen twice at [facility] and was told to follow up with GI. But has not yet. Unable to work due to pain. No vomiting. Has diarrhea.

## 2023-10-23 NOTE — Discharge Instructions (Addendum)
 Continue using the bentyl medication up to twice daily and follow up with the GI doctor whose contact information I provided.

## 2023-10-23 NOTE — ED Notes (Signed)
 IV team abs

## 2023-10-23 NOTE — ED Provider Notes (Signed)
 [place] [facility] Provider Note   CSN: [id number] Arrival date & time: 10/23/23  1028     History  Chief Complaint  Patient presents with   Abdominal Pain    Samantha Morse is a 23 y.o. female with past medical history significant for obesity, hidradenitis, asthma who presents with concern for abdominal pain for 2 weeks.  She was seen at [facility] and diagnosed with probable IBS, discharged with Augmentin and Bentyl, she reports no improvement with the above over the last 4 days.  Was supposed to follow-up with GI but has not had the opportunity to yet.  She reports she is unable to work secondary to pain, endorses 10/10 at this time.  She denies any nausea, vomiting.  She was briefly having some blood in her stool but reports this is resolved at this time.   Abdominal Pain      Home Medications Prior to Admission medications   Medication Sig Start Date End Date Taking? Authorizing Provider  albuterol (VENTOLIN HFA) 108 (90 Base) MCG/ACT inhaler Inhale 1-2 puffs into the lungs every 4 (four) hours as needed for wheezing or shortness of breath. 05/24/22   Carlisle Beers, FNP  amLODipine (NORVASC) 2.5 MG tablet Take 2.5 mg by mouth daily.    [provider]  amoxicillin-clavulanate (AUGMENTIN) 875-125 MG tablet Take 1 tablet by mouth every 12 (twelve) hours. 10/19/23   Melene Plan, DO  dicyclomine (BENTYL) 20 MG tablet Take 1 tablet (20 mg total) by mouth 2 (two) times daily. 10/19/23   Melene Plan, DO  gabapentin (NEURONTIN) 300 MG capsule Take 300 mg by mouth 3 (three) times daily. 03/27/22   [provider]  hydrOXYzine (ATARAX) 10 MG tablet Take by mouth. 02/09/23   [provider]  methocarbamol (ROBAXIN) 500 MG tablet Take 500 mg by mouth 3 (three) times daily. 03/28/22   [provider]  rizatriptan (MAXALT-MLT) 10 MG disintegrating tablet Take by mouth. 11/29/21   [provider]  Vitamin D,  Ergocalciferol, (DRISDOL) 1.25 MG (50000 UNIT) CAPS capsule Take 50,000 Units by mouth once a week. 03/04/22   [provider]      Allergies    Bee venom, Bee pollen, and Pollen extract    Review of Systems   Review of Systems  Gastrointestinal:  Positive for abdominal pain.  All other systems reviewed and are negative.   Physical Exam Updated Vital Signs BP (!) 184/84 (BP Location: Right Arm)   Pulse 90   Temp 98.6 F (37 C) (Oral)   Resp 16   Ht 5\' 5"  (1.651 m)   Wt (!) 149.7 kg   LMP 09/18/2023 (Approximate)   SpO2 100%   BMI 54.91 kg/m  Physical Exam Vitals and nursing note reviewed.  Constitutional:      General: She is not in acute distress.    Appearance: Normal appearance. She is obese.  HENT:     Head: Normocephalic and atraumatic.  Eyes:     General:        Right eye: No discharge.        Left eye: No discharge.  Cardiovascular:     Rate and Rhythm: Regular rhythm. Tachycardia present.     Heart sounds: No murmur heard.    No friction rub. No gallop.  Pulmonary:     Effort: Pulmonary effort is normal.     Breath sounds: Normal breath sounds.  Abdominal:     General: Bowel sounds  are normal.     Palpations: Abdomen is soft.     Comments: Diffuse tenderness of the abdomen, no rebound, rigidity, guarding throughout. Most focal ttp in the LLQ.   Skin:    General: Skin is warm and dry.     Capillary Refill: Capillary refill takes less than 2 seconds.  Neurological:     Mental Status: She is alert and oriented to person, place, and time.  Psychiatric:        Mood and Affect: Mood normal.        Behavior: Behavior normal.     ED Results / Procedures / Treatments   Labs (all labs ordered are listed, but only abnormal results are displayed) Labs Reviewed  LIPASE, BLOOD - Abnormal; Notable for the following components:      Result Value   Lipase 84 (*)    All other components within normal limits  COMPREHENSIVE METABOLIC PANEL - Abnormal;  Notable for the following components:   Total Protein 8.4 (*)    All other components within normal limits  CBC - Abnormal; Notable for the following components:   Platelets 416 (*)    All other components within normal limits  URINALYSIS, ROUTINE W REFLEX MICROSCOPIC - Abnormal; Notable for the following components:   Hgb urine dipstick MODERATE (*)    All other components within normal limits  HCG, SERUM, QUALITATIVE    EKG None  Radiology CT ABDOMEN PELVIS W CONTRAST Result Date: 10/23/2023 CLINICAL DATA:  Abdominal pain for 2 weeks. EXAM: CT ABDOMEN AND PELVIS WITH CONTRAST TECHNIQUE: Multidetector CT imaging of the abdomen and pelvis was performed using the standard protocol following bolus administration of intravenous contrast. RADIATION DOSE REDUCTION: This exam was performed according to the departmental dose-optimization program which includes automated exposure control, adjustment of the mA and/or kV according to patient size and/or use of iterative reconstruction technique. CONTRAST:  OMNIPAQUE IOHEXOL 300 MG/ML  SOLN COMPARISON:  None Available. FINDINGS: Lower chest: No acute abnormality. Hepatobiliary: No focal liver abnormality is seen. No gallstones, gallbladder wall thickening, or biliary dilatation. Pancreas: Unremarkable. No pancreatic ductal dilatation or surrounding inflammatory changes. Spleen: Normal in size without focal abnormality. Adrenals/Urinary Tract: Adrenal glands are unremarkable. Kidneys are normal, without renal calculi, focal lesion, or hydronephrosis. Bladder is unremarkable. Stomach/Bowel: Stomach is within normal limits. The appendix is surgically absent. No evidence of bowel wall thickening, distention, or inflammatory changes. Vascular/Lymphatic: No significant vascular findings are present. No enlarged abdominal or pelvic lymph nodes. Reproductive: Uterus and bilateral adnexa are unremarkable. Other: A 4.3 cm x 3.3 cm x 3.2 cm fat containing umbilical  hernia is noted. No abdominopelvic ascites. Musculoskeletal: No acute or significant osseous findings. IMPRESSION: 1. Fat-containing umbilical hernia. 2. Evidence of prior appendectomy. Electronically Signed   By: Aram Candela M.D.   On: 10/23/2023 21:56    Procedures Procedures    Medications Ordered in ED Medications  morphine (PF) 4 MG/ML injection 4 mg (4 mg Intravenous Given 10/23/23 2006)  dicyclomine (BENTYL) capsule 10 mg (10 mg Oral Given 10/23/23 1908)  iohexol (OMNIPAQUE) 300 MG/ML solution 100 mL (100 mLs Intravenous Contrast Given 10/23/23 2017)    ED Course/ Medical Decision Making/ A&P                                 Medical Decision Making Amount and/or Complexity of Data Reviewed Labs: ordered. Radiology: ordered.  Risk Prescription drug management.  This patient is a 23 y.o. female  who presents to the ED for concern of abdominal pain.   Differential diagnoses prior to evaluation: The emergent differential diagnosis includes, but is not limited to,  The causes of generalized abdominal pain include but are not limited to AAA, mesenteric ischemia, appendicitis, diverticulitis, DKA, gastritis, gastroenteritis, AMI, nephrolithiasis, pancreatitis, peritonitis, adrenal insufficiency,lead poisoning, iron toxicity, intestinal ischemia, constipation, UTI,SBO/LBO, splenic rupture, biliary disease, IBD, IBS, PUD, or hepatitis, Ectopic pregnancy, ovarian torsion . This is not an exhaustive differential.   Past Medical History / Co-morbidities / Social History: obesity, hidradenitis, asthma  Additional history: Chart reviewed. Pertinent results include: reviewed labwork from recent ED visits  Physical Exam: Physical exam performed. The pertinent findings include: Diffuse tenderness of the abdomen, no rebound, rigidity, guarding throughout. Most focal ttp in the LLQ.    Mild tachycardia, pulse 104 on recheck, some hypertension   Lab Tests/Imaging studies: I  personally interpreted labs/imaging and the pertinent results include: CMP unremarkable, CBC unremarkable, lipase is mildly elevated 84 but actually improved from recent, not 3 times the upper limit of normal at previous evaluation or today, pregnancy test negative.  She does have some hemoglobin in her urine, but she reports that she is on her menses.  I independently interpreted CT abdomen pelvis with contrast which shows no evidence of acute intraabdominal abnormality. I agree with the radiologist interpretation.   Medications: I ordered medication including morphine, bentyl for pain.  I have reviewed the patients home medicines and have made adjustments as needed.   Disposition: After consideration of the diagnostic results and the patients response to treatment, I feel that agree with previous provider assessment, likely IBS, discussed that she does not necessarily need to continue the Augmentin that she was placed on for possible diverticulitis because CT is now proving that she does not in fact have a diverticulitis.   emergency department workup does not suggest an emergent condition requiring admission or immediate intervention beyond what has been performed at this time. The plan is: as above. The patient is safe for discharge and has been instructed to return immediately for worsening symptoms, change in symptoms or any other concerns.  Final Clinical Impression(s) / ED Diagnoses Final diagnoses:  Left lower quadrant abdominal pain    Rx / DC Orders ED Discharge Orders     None         [place] 10/23/23 2312    Glyn Ade, MD 10/26/23 1615

## 2023-10-24 ENCOUNTER — Encounter: Payer: Self-pay | Admitting: Gastroenterology

## 2023-10-24 ENCOUNTER — Ambulatory Visit: Payer: Medicaid Other | Admitting: Dermatology

## 2023-10-30 DIAGNOSIS — Z113 Encounter for screening for infections with a predominantly sexual mode of transmission: Secondary | ICD-10-CM | POA: Diagnosis not present

## 2023-10-30 DIAGNOSIS — Z6841 Body Mass Index (BMI) 40.0 and over, adult: Secondary | ICD-10-CM | POA: Diagnosis not present

## 2023-10-30 DIAGNOSIS — Z0000 Encounter for general adult medical examination without abnormal findings: Secondary | ICD-10-CM | POA: Diagnosis not present

## 2023-10-30 DIAGNOSIS — I10 Essential (primary) hypertension: Secondary | ICD-10-CM | POA: Diagnosis not present

## 2023-10-30 DIAGNOSIS — Z114 Encounter for screening for human immunodeficiency virus [HIV]: Secondary | ICD-10-CM | POA: Diagnosis not present

## 2023-10-30 DIAGNOSIS — R7989 Other specified abnormal findings of blood chemistry: Secondary | ICD-10-CM | POA: Diagnosis not present

## 2023-10-31 DIAGNOSIS — R194 Change in bowel habit: Secondary | ICD-10-CM | POA: Diagnosis not present

## 2023-10-31 DIAGNOSIS — R1030 Lower abdominal pain, unspecified: Secondary | ICD-10-CM | POA: Diagnosis not present

## 2023-11-07 DIAGNOSIS — R1084 Generalized abdominal pain: Secondary | ICD-10-CM | POA: Diagnosis not present

## 2023-11-10 ENCOUNTER — Other Ambulatory Visit: Payer: Self-pay | Admitting: Neurology

## 2023-12-07 ENCOUNTER — Ambulatory Visit: Payer: Medicaid Other | Admitting: Gastroenterology

## 2023-12-07 NOTE — Progress Notes (Deleted)
 Samantha Morse

## 2023-12-30 ENCOUNTER — Other Ambulatory Visit: Payer: Self-pay | Admitting: Neurology

## 2024-01-23 ENCOUNTER — Ambulatory Visit (INDEPENDENT_AMBULATORY_CARE_PROVIDER_SITE_OTHER): Payer: Self-pay | Admitting: Physician Assistant

## 2024-01-23 DIAGNOSIS — Z91199 Patient's noncompliance with other medical treatment and regimen due to unspecified reason: Secondary | ICD-10-CM

## 2024-01-23 NOTE — Progress Notes (Signed)
 Patient no-showed today's appointment.

## 2024-01-24 ENCOUNTER — Ambulatory Visit (HOSPITAL_COMMUNITY): Payer: Self-pay

## 2024-04-01 DIAGNOSIS — L089 Local infection of the skin and subcutaneous tissue, unspecified: Secondary | ICD-10-CM | POA: Diagnosis not present

## 2024-04-01 DIAGNOSIS — R7989 Other specified abnormal findings of blood chemistry: Secondary | ICD-10-CM | POA: Diagnosis not present

## 2024-04-01 DIAGNOSIS — Z6841 Body Mass Index (BMI) 40.0 and over, adult: Secondary | ICD-10-CM | POA: Diagnosis not present

## 2024-04-01 DIAGNOSIS — R7309 Other abnormal glucose: Secondary | ICD-10-CM | POA: Diagnosis not present

## 2024-04-01 DIAGNOSIS — R21 Rash and other nonspecific skin eruption: Secondary | ICD-10-CM | POA: Diagnosis not present

## 2024-04-01 DIAGNOSIS — Z113 Encounter for screening for infections with a predominantly sexual mode of transmission: Secondary | ICD-10-CM | POA: Diagnosis not present

## 2024-04-01 DIAGNOSIS — B9689 Other specified bacterial agents as the cause of diseases classified elsewhere: Secondary | ICD-10-CM | POA: Diagnosis not present
